# Patient Record
Sex: Female | Born: 1955 | Race: Black or African American | Hispanic: No | Smoking: Former smoker
Health system: Southern US, Community
[De-identification: ages and names within clinical notes are randomized; demographics above are authoritative.]

## PROBLEM LIST (undated history)

## (undated) DIAGNOSIS — F32A Depression, unspecified: Secondary | ICD-10-CM

## (undated) DIAGNOSIS — F329 Major depressive disorder, single episode, unspecified: Secondary | ICD-10-CM

## (undated) DIAGNOSIS — F99 Mental disorder, not otherwise specified: Secondary | ICD-10-CM

## (undated) DIAGNOSIS — I10 Essential (primary) hypertension: Secondary | ICD-10-CM

## (undated) HISTORY — DX: Mental disorder, not otherwise specified: F99

## (undated) HISTORY — PX: OTHER SURGICAL HISTORY: SHX169

## (undated) HISTORY — PX: ABDOMINAL HYSTERECTOMY: SHX81

## (undated) HISTORY — PX: TUBAL LIGATION: SHX77

---

## 2000-04-23 ENCOUNTER — Emergency Department (HOSPITAL_COMMUNITY): Admission: EM | Admit: 2000-04-23 | Discharge: 2000-04-23 | Payer: Self-pay | Admitting: Emergency Medicine

## 2000-04-23 ENCOUNTER — Encounter: Payer: Self-pay | Admitting: Emergency Medicine

## 2001-01-22 ENCOUNTER — Encounter: Admission: RE | Admit: 2001-01-22 | Discharge: 2001-01-22 | Payer: Self-pay | Admitting: Internal Medicine

## 2001-08-23 ENCOUNTER — Emergency Department (HOSPITAL_COMMUNITY): Admission: EM | Admit: 2001-08-23 | Discharge: 2001-08-23 | Payer: Self-pay | Admitting: Emergency Medicine

## 2002-05-25 ENCOUNTER — Emergency Department (HOSPITAL_COMMUNITY): Admission: EM | Admit: 2002-05-25 | Discharge: 2002-05-25 | Payer: Self-pay

## 2002-07-07 ENCOUNTER — Emergency Department (HOSPITAL_COMMUNITY): Admission: EM | Admit: 2002-07-07 | Discharge: 2002-07-07 | Payer: Self-pay | Admitting: Emergency Medicine

## 2002-10-24 ENCOUNTER — Ambulatory Visit (HOSPITAL_COMMUNITY): Admission: RE | Admit: 2002-10-24 | Discharge: 2002-10-24 | Payer: Self-pay | Admitting: Radiology

## 2002-10-31 ENCOUNTER — Encounter (INDEPENDENT_AMBULATORY_CARE_PROVIDER_SITE_OTHER): Payer: Self-pay | Admitting: *Deleted

## 2002-10-31 ENCOUNTER — Encounter: Admission: RE | Admit: 2002-10-31 | Discharge: 2002-10-31 | Payer: Self-pay | Admitting: Radiology

## 2003-03-21 ENCOUNTER — Encounter: Payer: Self-pay | Admitting: Emergency Medicine

## 2003-03-21 ENCOUNTER — Emergency Department (HOSPITAL_COMMUNITY): Admission: AD | Admit: 2003-03-21 | Discharge: 2003-03-21 | Payer: Self-pay | Admitting: Emergency Medicine

## 2004-04-04 ENCOUNTER — Emergency Department (HOSPITAL_COMMUNITY): Admission: EM | Admit: 2004-04-04 | Discharge: 2004-04-04 | Payer: Self-pay | Admitting: Family Medicine

## 2004-04-07 ENCOUNTER — Emergency Department (HOSPITAL_COMMUNITY): Admission: EM | Admit: 2004-04-07 | Discharge: 2004-04-07 | Payer: Self-pay | Admitting: *Deleted

## 2004-07-07 ENCOUNTER — Emergency Department (HOSPITAL_COMMUNITY): Admission: EM | Admit: 2004-07-07 | Discharge: 2004-07-07 | Payer: Self-pay | Admitting: *Deleted

## 2004-12-17 ENCOUNTER — Ambulatory Visit: Payer: Self-pay | Admitting: Internal Medicine

## 2004-12-27 ENCOUNTER — Ambulatory Visit: Payer: Self-pay | Admitting: *Deleted

## 2005-02-25 ENCOUNTER — Ambulatory Visit: Payer: Self-pay | Admitting: Internal Medicine

## 2005-04-30 ENCOUNTER — Ambulatory Visit: Payer: Self-pay | Admitting: Internal Medicine

## 2005-05-02 ENCOUNTER — Ambulatory Visit (HOSPITAL_COMMUNITY): Admission: RE | Admit: 2005-05-02 | Discharge: 2005-05-02 | Payer: Self-pay | Admitting: Internal Medicine

## 2005-05-06 ENCOUNTER — Ambulatory Visit (HOSPITAL_COMMUNITY): Admission: RE | Admit: 2005-05-06 | Discharge: 2005-05-06 | Payer: Self-pay | Admitting: Family Medicine

## 2005-05-09 ENCOUNTER — Ambulatory Visit: Payer: Self-pay | Admitting: Internal Medicine

## 2005-05-28 ENCOUNTER — Ambulatory Visit: Payer: Self-pay | Admitting: Nurse Practitioner

## 2006-01-16 ENCOUNTER — Ambulatory Visit: Payer: Self-pay | Admitting: Family Medicine

## 2006-03-05 ENCOUNTER — Ambulatory Visit: Payer: Self-pay | Admitting: Internal Medicine

## 2006-03-20 ENCOUNTER — Ambulatory Visit: Payer: Self-pay | Admitting: Family Medicine

## 2006-07-11 ENCOUNTER — Emergency Department (HOSPITAL_COMMUNITY): Admission: EM | Admit: 2006-07-11 | Discharge: 2006-07-12 | Payer: Self-pay | Admitting: Emergency Medicine

## 2006-08-31 ENCOUNTER — Ambulatory Visit: Payer: Self-pay | Admitting: Internal Medicine

## 2006-09-03 ENCOUNTER — Ambulatory Visit: Payer: Self-pay | Admitting: Internal Medicine

## 2006-09-07 ENCOUNTER — Ambulatory Visit: Payer: Self-pay | Admitting: Internal Medicine

## 2006-11-02 ENCOUNTER — Ambulatory Visit: Payer: Self-pay | Admitting: Internal Medicine

## 2007-07-06 DIAGNOSIS — L03119 Cellulitis of unspecified part of limb: Secondary | ICD-10-CM

## 2007-07-06 DIAGNOSIS — K625 Hemorrhage of anus and rectum: Secondary | ICD-10-CM

## 2007-07-06 DIAGNOSIS — R1013 Epigastric pain: Secondary | ICD-10-CM

## 2007-07-06 DIAGNOSIS — K3189 Other diseases of stomach and duodenum: Secondary | ICD-10-CM

## 2007-07-06 DIAGNOSIS — K120 Recurrent oral aphthae: Secondary | ICD-10-CM | POA: Insufficient documentation

## 2007-07-06 DIAGNOSIS — L02419 Cutaneous abscess of limb, unspecified: Secondary | ICD-10-CM | POA: Insufficient documentation

## 2007-09-01 ENCOUNTER — Encounter (INDEPENDENT_AMBULATORY_CARE_PROVIDER_SITE_OTHER): Payer: Self-pay | Admitting: *Deleted

## 2007-10-28 ENCOUNTER — Ambulatory Visit: Payer: Self-pay | Admitting: Internal Medicine

## 2007-10-28 LAB — CONVERTED CEMR LAB
ALT: 9 units/L (ref 0–35)
AST: 14 units/L (ref 0–37)
Alkaline Phosphatase: 91 units/L (ref 39–117)
BUN: 10 mg/dL (ref 6–23)
Chloride: 106 meq/L (ref 96–112)
Creatinine, Ser: 0.69 mg/dL (ref 0.40–1.20)
Glucose, Bld: 87 mg/dL (ref 70–99)
TSH: 1.077 microintl units/mL (ref 0.350–5.50)
Total Protein: 7.2 g/dL (ref 6.0–8.3)

## 2009-04-23 ENCOUNTER — Ambulatory Visit: Payer: Self-pay | Admitting: *Deleted

## 2009-04-23 ENCOUNTER — Ambulatory Visit: Payer: Self-pay | Admitting: Family Medicine

## 2009-05-18 ENCOUNTER — Ambulatory Visit: Payer: Self-pay | Admitting: Family Medicine

## 2009-05-18 ENCOUNTER — Ambulatory Visit (HOSPITAL_COMMUNITY): Admission: RE | Admit: 2009-05-18 | Discharge: 2009-05-18 | Payer: Self-pay | Admitting: Family Medicine

## 2009-05-23 ENCOUNTER — Ambulatory Visit: Payer: Self-pay | Admitting: Family Medicine

## 2010-03-29 ENCOUNTER — Ambulatory Visit: Payer: Self-pay | Admitting: Family Medicine

## 2010-07-09 ENCOUNTER — Emergency Department (HOSPITAL_COMMUNITY): Admission: EM | Admit: 2010-07-09 | Discharge: 2010-07-09 | Payer: Self-pay | Admitting: Emergency Medicine

## 2010-07-25 ENCOUNTER — Ambulatory Visit: Payer: Self-pay | Admitting: Family Medicine

## 2011-11-24 ENCOUNTER — Other Ambulatory Visit: Payer: Self-pay | Admitting: Obstetrics and Gynecology

## 2011-11-24 DIAGNOSIS — Z1231 Encounter for screening mammogram for malignant neoplasm of breast: Secondary | ICD-10-CM

## 2011-11-25 ENCOUNTER — Other Ambulatory Visit: Payer: Self-pay | Admitting: Obstetrics and Gynecology

## 2011-11-25 ENCOUNTER — Ambulatory Visit (HOSPITAL_COMMUNITY): Payer: Self-pay | Attending: Obstetrics and Gynecology

## 2011-11-25 ENCOUNTER — Ambulatory Visit (INDEPENDENT_AMBULATORY_CARE_PROVIDER_SITE_OTHER): Payer: Self-pay | Admitting: *Deleted

## 2011-11-25 VITALS — BP 172/94 | HR 73 | Temp 97.4°F | Resp 20 | Ht 67.0 in | Wt 232.6 lb

## 2011-11-25 DIAGNOSIS — Z01419 Encounter for gynecological examination (general) (routine) without abnormal findings: Secondary | ICD-10-CM

## 2011-11-25 DIAGNOSIS — R229 Localized swelling, mass and lump, unspecified: Secondary | ICD-10-CM

## 2011-11-25 DIAGNOSIS — N898 Other specified noninflammatory disorders of vagina: Secondary | ICD-10-CM

## 2011-11-25 DIAGNOSIS — R2230 Localized swelling, mass and lump, unspecified upper limb: Secondary | ICD-10-CM

## 2011-11-25 DIAGNOSIS — N63 Unspecified lump in unspecified breast: Secondary | ICD-10-CM

## 2011-11-25 NOTE — Progress Notes (Signed)
Complaints of swelling in left axilla.  Pap Smear:    Completed Pap smear today since last Pap smear was around 6 years ago per patient. Patient has a history of a hysterectomy for fibroids. Per patient has no history of abnormal Pap smears. If this Pap smear comes back normal then patient will not need any further Pap smears per BCCCP and ACOG guidelines. No Pap smear results in EPIC.  Physical exam: Breasts Breasts symmetrical. No skin abnormalities bilateral breasts. No nipple retraction bilateral breasts. No nipple discharge bilateral breasts. No lymphadenopathy. No lumps palpated right breast. Palpated enlarged area in left axilla that patient complains of tenderness at times. Otherwise no lumps palpated in left breast. Patient referred to the [facility] for Diagnostic Mammogram and possible breast ultrasound.          Pelvic/Bimanual   Ext Genitalia No lesions, no swelling and no discharge observed on external genitalia.         Vagina Vagina pink and of normal texture. Vagina friable with broom. No lesions observed in vagina. White frothy discharge observed with odor in vagina. Wet prep completed today.         Cervix Cervix is not present related to a history of a hysterectomy.          Uterus Uterus is absent due to a history of a hysterectomy.         Adnexae Bilateral ovaries not palpable. Patient unsure if ovaries were removed with hysterectomy. No tenderness on palpation.        Rectovaginal No rectal exam completed today since patient had no rectal complaints. No skin abnormalities observed on exam.

## 2011-11-25 NOTE — Patient Instructions (Signed)
Taught patient how to perform BSE and gave educational materials to take home. Completed Pap smear today since patient thinks last Pap smear was around 6 years ago. Patient has a history of a hysterectomy for fibroids. Told patient if this Pap smear comes back normal that she will not need further Pap smears per BCCCP guidelines. Informed patient that will call with results for wet prep. Patient is scheduled for a diagnostic mammogram and possible breast ultrasound December 10, 2011 at 1340.  Patient aware of appointment and will be there. Let patient know will follow up with her within the next couple weeks with results by letter or phone. Patient verbalized understanding.

## 2011-11-27 LAB — WET PREP, GENITAL

## 2011-11-28 ENCOUNTER — Telehealth: Payer: Self-pay | Admitting: *Deleted

## 2011-11-28 MED ORDER — METRONIDAZOLE 500 MG PO TABS: 500.0000 mg | ORAL_TABLET | Freq: Two times a day (BID) | ORAL | Status: DC

## 2011-11-28 NOTE — Progress Notes (Signed)
Addended by: Catalina Antigua on: 11/28/2011 11:04 AM   Modules accepted: Orders

## 2011-11-28 NOTE — Telephone Encounter (Signed)
Called patient to give results to Pap smear and wet prep. Told patient the Pap smear showed Trichomonas and the Wet Prep showed BV. Let her know that a prescription for Flagyl  has been sent to [facility] for her to pick up. Let her know she will need to take as prescribed and avoid alcohol while taking. Told patient that her partner will need to be treated also to avoid re-infection. Let her know [facility] of [facility] or go to his primary care physician. Told patient if she has any further questions to call me. Patient verbalized understanding.

## 2011-12-22 ENCOUNTER — Encounter (HOSPITAL_COMMUNITY): Payer: Self-pay | Admitting: *Deleted

## 2011-12-22 ENCOUNTER — Emergency Department (HOSPITAL_COMMUNITY): Payer: Self-pay

## 2011-12-22 ENCOUNTER — Emergency Department (HOSPITAL_COMMUNITY)
Admission: EM | Admit: 2011-12-22 | Discharge: 2011-12-22 | Disposition: A | Discharge status: Home / Self Care | Payer: Self-pay | Attending: Emergency Medicine | Admitting: Emergency Medicine

## 2011-12-22 ENCOUNTER — Other Ambulatory Visit: Payer: Self-pay

## 2011-12-22 DIAGNOSIS — F17200 Nicotine dependence, unspecified, uncomplicated: Secondary | ICD-10-CM | POA: Insufficient documentation

## 2011-12-22 DIAGNOSIS — R071 Chest pain on breathing: Secondary | ICD-10-CM | POA: Insufficient documentation

## 2011-12-22 DIAGNOSIS — R0602 Shortness of breath: Secondary | ICD-10-CM | POA: Insufficient documentation

## 2011-12-22 DIAGNOSIS — R0789 Other chest pain: Secondary | ICD-10-CM

## 2011-12-22 LAB — COMPREHENSIVE METABOLIC PANEL
Alkaline Phosphatase: 93 U/L (ref 39–117)
CO2: 22 mEq/L (ref 19–32)
Calcium: 9.4 mg/dL (ref 8.4–10.5)
Chloride: 107 mEq/L (ref 96–112)
Creatinine, Ser: 0.62 mg/dL (ref 0.50–1.10)
Glucose, Bld: 80 mg/dL (ref 70–99)
Potassium: 4.4 mEq/L (ref 3.5–5.1)
Total Protein: 7.2 g/dL (ref 6.0–8.3)

## 2011-12-22 LAB — CBC
MCH: 29.1 pg (ref 26.0–34.0)
MCHC: 32.9 g/dL (ref 30.0–36.0)
MCV: 88.4 fL (ref 78.0–100.0)
Platelets: 260 10*3/uL (ref 150–400)
RDW: 13.8 % (ref 11.5–15.5)

## 2011-12-22 LAB — CARDIAC PANEL(CRET KIN+CKTOT+MB+TROPI): Total CK: 123 U/L (ref 7–177)

## 2011-12-22 MED ORDER — IBUPROFEN 800 MG PO TABS: 800.0000 mg | ORAL_TABLET | Freq: Three times a day (TID) | ORAL | Status: AC

## 2011-12-22 MED ORDER — SODIUM CHLORIDE 0.9 % IV SOLN
999.0000 mL | Freq: Once | INTRAVENOUS | Status: AC
  Administered 2011-12-22: 999 mL via INTRAVENOUS

## 2011-12-22 MED ORDER — KETOROLAC TROMETHAMINE 30 MG/ML IJ SOLN
30.0000 mg | Freq: Once | INTRAMUSCULAR | Status: AC
  Administered 2011-12-22: 30 mg via INTRAVENOUS
  Filled 2011-12-22: qty 1

## 2011-12-22 NOTE — ED Provider Notes (Signed)
History     CSN: [id number]  Arrival date & time 12/22/11  0746   First MD Initiated Contact with Patient 12/22/11 0801      Chief Complaint  Patient presents with  . Chest Pain    (Consider location/radiation/quality/duration/timing/severity/associated sxs/prior treatment) HPI The patient presents with right sided chest pain, mild associated shortness of breath. The pain began insidiously, 3 weeks ago, since onset has been intermittent. The pain is focally about the right inferior anterior axillary line. The pain is described as "weird". It is nonradiating, not improved with Advil, worse with positioning, and (minimally) with deep inspiration. No left-sided chest pain, no near-syncope, no vomiting. Patient also describes mild diaphoresis. Pain is nonexertional. The patient denies smoking, significant travel history. She does note significant family history of cardiac disease, including 2 sisters with implanted cardiac devices Past Medical History  Diagnosis Date  . Mental disorder     No past surgical history on file.  Family History  Problem Relation Age of Onset  . Breast cancer Mother   . Breast cancer Sister     History  Substance Use Topics  . Smoking status: Current Some Day Smoker -- 0.1 packs/day for 10 years    Types: Cigarettes  . Smokeless tobacco: Not on file  . Alcohol Use: No    OB History    Grav Para Term Preterm Abortions TAB SAB Ect Mult Living   2         2      Review of Systems  Constitutional:       HPI  HENT:       HPI otherwise negative  Eyes: Negative.   Respiratory:       HPI, otherwise negative  Cardiovascular:       HPI, otherwise nmegative  Gastrointestinal: Negative for vomiting.  Genitourinary:       HPI, otherwise negative  Musculoskeletal:       HPI, otherwise negative  Skin: Negative.   Neurological: Negative for syncope.    Allergies  Sulfonamide derivatives and Tramadol hcl  Home Medications   Current Outpatient  Rx  Name Route Sig Dispense Refill  . IBUPROFEN 200 MG PO TABS Oral Take 400 mg by mouth every 6 (six) hours as needed. For pain       BP 131/70  Pulse 74  Temp(Src) 97.4 F (36.3 C) (Oral)  Resp 18  Ht 5\' 7"  (1.702 m)  Wt 240 lb (108.863 kg)  BMI 37.59 kg/m2  SpO2 96%  Physical Exam  Nursing note and vitals reviewed. Constitutional: She is oriented to person, place, and time. She appears well-developed and well-nourished. No distress.  HENT:  Head: Normocephalic and atraumatic.  Eyes: Conjunctivae and EOM are normal.  Cardiovascular: Normal rate and regular rhythm.   Pulmonary/Chest: Effort normal and breath sounds normal. No stridor. No respiratory distress.       Pain on palpation about the right inferior anterior axillary line without appreciable deformity  Abdominal: She exhibits no distension.  Musculoskeletal: She exhibits no edema.  Neurological: She is alert and oriented to person, place, and time. No cranial nerve deficit.  Skin: Skin is warm and dry.  Psychiatric: She has a normal mood and affect.    ED Course  Procedures (including critical care time)   Labs Reviewed  CARDIAC PANEL(CRET KIN+CKTOT+MB+TROPI)  CBC  COMPREHENSIVE METABOLIC PANEL   No results found.   No diagnosis found.  Pulse ox 98%, room air normal Cardiac monitor 75, sinus rhythm,  normal  CXR: reviewed by me, no acute findings     Date: 12/22/2011  Rate: 65  Rhythm: normal sinus rhythm  QRS Axis: normal  Intervals: normal  ST/T Wave abnormalities: normal and nonspecific T wave changes  Conduction Disutrbances:none  Narrative Interpretation:   Old EKG Reviewed: none available ABNORMAL ECG   MDM  This 67 female presents with right sided chest pain. On exam the patient is in no distress, is not tachypneic, nor hypoxic. The patient's story is most consistent with musculoskeletal etiology, though with her notable family history of cardiac disease she had laboratory evaluation. The  patient's labs are reassuring, as is her x-ray that does not show a notable findings. The patient's ECG is a similarly reassuring. Given his absence of acute findings, the patient's mild improvement with ED interventions, she'll be discharged with explicit care instructions for this episode of chest wall pain       Gerhard Munch, MD 12/22/11 1034

## 2011-12-22 NOTE — ED Notes (Signed)
C/o intermittent right side CP, SOB, diaphoresis x 3 weeks. Denies cold,cough.

## 2011-12-22 NOTE — ED Notes (Signed)
Pt d/c home in NAD. Pt states that she will catch the bus back to the [facility] [place] she [facility].

## 2011-12-22 NOTE — ED Notes (Signed)
Pt in NAD at this time. Pt resting with ice placed on right side.

## 2011-12-23 ENCOUNTER — Other Ambulatory Visit: Payer: Self-pay | Admitting: Obstetrics and Gynecology

## 2011-12-23 ENCOUNTER — Ambulatory Visit
Admission: RE | Admit: 2011-12-23 | Discharge: 2011-12-23 | Disposition: A | Discharge status: Home / Self Care | Payer: Self-pay | Source: Ambulatory Visit | Attending: Obstetrics and Gynecology | Admitting: Obstetrics and Gynecology

## 2011-12-23 DIAGNOSIS — N63 Unspecified lump in unspecified breast: Secondary | ICD-10-CM

## 2011-12-29 ENCOUNTER — Telehealth: Payer: Self-pay | Admitting: *Deleted

## 2011-12-29 NOTE — Telephone Encounter (Signed)
Called patient to follow up on Diagnostic mammogram results. Patient stated she did receive [facility] from the [facility] and everything was normal. Reminded patient she will need a screening mammogram at 1 year. Also, asked patient if she picked up her prescription for Flagyl and patient stated she did. Asked patient if she is still having symptoms and patient stated no. Patient did state her partner is unwilling to follow up and get treatment. Patient stated she is no longer involved with partner. Patient verbalized understanding.

## 2012-01-29 ENCOUNTER — Other Ambulatory Visit: Payer: Self-pay | Admitting: Obstetrics and Gynecology

## 2012-01-29 NOTE — Telephone Encounter (Signed)
Pt left message on voice mail that she is still having vag d/c with odor.  She states that she called her pharmacy for a refill also and they have requested medication. I told pt that I will send the refill authorization (per standing protocol).  Pt voiced understanding.

## 2013-02-01 ENCOUNTER — Emergency Department (HOSPITAL_COMMUNITY)
Admission: EM | Admit: 2013-02-01 | Discharge: 2013-02-01 | Disposition: A | Discharge status: Home / Self Care | Payer: Self-pay | Attending: Emergency Medicine | Admitting: Emergency Medicine

## 2013-02-01 ENCOUNTER — Encounter (HOSPITAL_COMMUNITY): Payer: Self-pay | Admitting: Emergency Medicine

## 2013-02-01 DIAGNOSIS — M545 Low back pain, unspecified: Secondary | ICD-10-CM | POA: Insufficient documentation

## 2013-02-01 DIAGNOSIS — M549 Dorsalgia, unspecified: Secondary | ICD-10-CM

## 2013-02-01 DIAGNOSIS — Z90711 Acquired absence of uterus with remaining cervical stump: Secondary | ICD-10-CM | POA: Insufficient documentation

## 2013-02-01 DIAGNOSIS — G8929 Other chronic pain: Secondary | ICD-10-CM | POA: Insufficient documentation

## 2013-02-01 DIAGNOSIS — Z8739 Personal history of other diseases of the musculoskeletal system and connective tissue: Secondary | ICD-10-CM | POA: Insufficient documentation

## 2013-02-01 DIAGNOSIS — Z9851 Tubal ligation status: Secondary | ICD-10-CM | POA: Insufficient documentation

## 2013-02-01 DIAGNOSIS — F17200 Nicotine dependence, unspecified, uncomplicated: Secondary | ICD-10-CM | POA: Insufficient documentation

## 2013-02-01 DIAGNOSIS — Z8659 Personal history of other mental and behavioral disorders: Secondary | ICD-10-CM | POA: Insufficient documentation

## 2013-02-01 DIAGNOSIS — M79609 Pain in unspecified limb: Secondary | ICD-10-CM | POA: Insufficient documentation

## 2013-02-01 DIAGNOSIS — R109 Unspecified abdominal pain: Secondary | ICD-10-CM | POA: Insufficient documentation

## 2013-02-01 LAB — CBC
MCH: 28.4 pg (ref 26.0–34.0)
MCV: 87.2 fL (ref 78.0–100.0)
RDW: 13.9 % (ref 11.5–15.5)
WBC: 4.3 10*3/uL (ref 4.0–10.5)

## 2013-02-01 LAB — URINALYSIS, ROUTINE W REFLEX MICROSCOPIC
Bilirubin Urine: NEGATIVE
Ketones, ur: NEGATIVE mg/dL
Nitrite: NEGATIVE
Protein, ur: NEGATIVE mg/dL
Urobilinogen, UA: 0.2 mg/dL (ref 0.0–1.0)

## 2013-02-01 MED ORDER — OXYCODONE-ACETAMINOPHEN 5-325 MG PO TABS
2.0000 | ORAL_TABLET | Freq: Once | ORAL | Status: AC
  Administered 2013-02-01: 2 via ORAL
  Filled 2013-02-01: qty 2

## 2013-02-01 MED ORDER — ONDANSETRON 4 MG PO TBDP
4.0000 mg | ORAL_TABLET | Freq: Once | ORAL | Status: AC
  Administered 2013-02-01: 4 mg via ORAL
  Filled 2013-02-01: qty 1

## 2013-02-01 MED ORDER — HYDROCODONE-ACETAMINOPHEN 5-325 MG PO TABS: 1.0000 | ORAL_TABLET | ORAL | Status: DC | PRN

## 2013-02-01 MED ORDER — MELOXICAM 15 MG PO TABS: 15.0000 mg | ORAL_TABLET | Freq: Every day | ORAL | Status: AC

## 2013-02-01 MED ORDER — DEXAMETHASONE SODIUM PHOSPHATE 10 MG/ML IJ SOLN
10.0000 mg | Freq: Once | INTRAMUSCULAR | Status: AC
  Administered 2013-02-01: 10 mg via INTRAMUSCULAR
  Filled 2013-02-01: qty 1

## 2013-02-01 NOTE — ED Provider Notes (Signed)
History     CSN: [id number]  Arrival date & time 02/01/13  4098   First MD Initiated Contact with Patient 02/01/13 [phone]      Chief Complaint  Patient presents with  . Back Pain  . Flank Pain    left side    (Consider location/radiation/quality/duration/timing/severity/associated sxs/prior treatment) HPI  57 year old female with past medical history of chronic back pain presents the emergency department with chief complaint of severe lower back pain.  Patient has known degenerative disc disease and annular tear confirmed on MRI.  She has had chronic left-sided sciatic pain pattern for many years.  Patient states that Sunday she reached up to turn a doorknob and looked her back spasm.  She's had severe pain not and states that she "could not take it anymore."  Patient states that she does not use any pain medications and has no current primary care physician.  She has been vocational rehabilitation.  The patient was a [facility] patient. She compliants of shooting pain left leg 10/10. Unable to complete ADLs at this time.  Ambulatory. Denies urinary and vaginal sxs. She c/o new onset swelling in the left leg. Denies cp/sob Denies weakness, loss of bowel/bladder function or saddle anesthesia. Denies neck stiffness, headache, rash.  Denies fever or recent procedures to back.    Past Medical History  Diagnosis Date  . Mental disorder     Past Surgical History  Procedure Laterality Date  . Tubal ligation Bilateral   . Abdominal hysterectomy      partial  . Cyst removed from spine      Family History  Problem Relation Age of Onset  . Breast cancer Mother   . Breast cancer Sister     History  Substance Use Topics  . Smoking status: Current Some Day Smoker -- 0.10 packs/day for 10 years    Types: Cigarettes  . Smokeless tobacco: Not on file  . Alcohol Use: No    OB History   Grav Para Term Preterm Abortions TAB SAB Ect Mult Living   2         2      Review of  Systems Ten systems reviewed and are negative for acute change, except as noted in the HPI.   Allergies  Sulfonamide derivatives and Tramadol hcl  Home Medications   Current Outpatient Rx  Name  Route  Sig  Dispense  Refill  . ibuprofen (ADVIL,MOTRIN) 200 MG tablet   Oral   Take 400 mg by mouth every 6 (six) hours as needed. For pain          . naproxen sodium (ANAPROX) 220 MG tablet   Oral   Take 220 mg by mouth 2 (two) times daily with a meal.           BP 141/69  Pulse 60  Temp(Src) 98.4 F (36.9 C) (Oral)  Resp 18  Ht 5' 7.5" (1.715 m)  Wt 240 lb (108.863 kg)  BMI 37.01 kg/m2  SpO2 100%  Physical Exam  Physical Exam  Nursing note and vitals reviewed. Constitutional: She is oriented to person, place, and time. She appears well-developed and well-nourished. No distress.  HENT:  Head: Normocephalic and atraumatic.  Eyes: Conjunctivae normal and EOM are normal. Pupils are equal, round, and reactive to light. No scleral icterus.  Neck: Normal range of motion.  Cardiovascular: Normal rate, regular rhythm and normal heart sounds.  Exam reveals no gallop and no friction rub.  Distal pulses intact.  No signs of DVT. Homans negative. No Evidence of UL leg swelling. No murmur heard. Pulmonary/Chest: Effort normal and breath sounds normal. No respiratory distress.  Abdominal: Soft. Bowel sounds are normal. She exhibits no distension and no mass. There is no tenderness. There is no guarding.  Neurological: She is alert and oriented to person, place, and time.  Musculoskeletal:Back exam: antalgic gait, limited range of motion, pain with motion noted during exam, tenderness noted Lumbar paraspinals, sacroiliac joints and sciatic notches nontender, positive straight-leg raise L, Normal DTRs. Sensation intact. Strength/ROM limited by pain.   Skin: Skin is warm and dry. She is not diaphoretic.    ED Course  Procedures (including critical care time)  Labs Reviewed  CBC   URINALYSIS, ROUTINE W REFLEX MICROSCOPIC   No results found.   1. Back pain       MDM  7:59 AM BP 141/69  Pulse 60  Temp(Src) 98.4 F (36.9 C) (Oral)  Resp 18  Ht 5' 7.5" (1.715 m)  Wt 240 lb (108.863 kg)  BMI 37.01 kg/m2  SpO2 100% Patient with pain issue. UA and labs pending.  Patient handed me her radiology report that showed her MRI. I do not suspect DVT in leg as patients' subjective c/o leg swelling is not apparent pn PE. Will r/o UTI as cause of pain.  No imaging necessary at this time as there was no recent MOI.    10:16 AM No UTI. Patient is  Very much decreased with 2 Percocet.  She does state that she feels "a little woozy."  Patient with back pain.  No neurological deficits and normal neuro exam.  Patient can walk but states is painful.  No loss of bowel or bladder control.  No concern for cauda equina.  No fever, night sweats, weight loss, h/o cancer, IVDU.  RICE protocol and pain medicine indicated and discussed with patient.     Arthor Captain, PA-C 02/01/13 1024

## 2013-02-01 NOTE — ED Notes (Signed)
Pt resting comfortably, pain is reduced. No needs at this time.

## 2013-02-01 NOTE — ED Provider Notes (Signed)
Medical screening examination/treatment/procedure(s) were performed by non-physician practitioner and as supervising physician I was immediately available for consultation/collaboration.   Carleene Cooper III, MD 02/01/13 [phone]

## 2013-02-01 NOTE — ED Notes (Signed)
Patient complaint of lower back and left flank pain radiating into left lower quadrant.  Patient states she has had lower back problems for a while.  Patient also states that she was frequent falls.

## 2013-02-01 NOTE — ED Notes (Signed)
Pt has ride coming to pick her up.

## 2014-01-28 ENCOUNTER — Encounter (HOSPITAL_COMMUNITY): Payer: Self-pay | Admitting: Emergency Medicine

## 2014-01-28 ENCOUNTER — Emergency Department (INDEPENDENT_AMBULATORY_CARE_PROVIDER_SITE_OTHER)
Admission: EM | Admit: 2014-01-28 | Discharge: 2014-01-28 | Disposition: A | Discharge status: Home / Self Care | Payer: Self-pay | Source: Home / Self Care | Attending: Family Medicine | Admitting: Family Medicine

## 2014-01-28 ENCOUNTER — Emergency Department (INDEPENDENT_AMBULATORY_CARE_PROVIDER_SITE_OTHER): Payer: Self-pay

## 2014-01-28 DIAGNOSIS — J309 Allergic rhinitis, unspecified: Secondary | ICD-10-CM

## 2014-01-28 LAB — POCT RAPID STREP A: STREPTOCOCCUS, GROUP A SCREEN (DIRECT): NEGATIVE

## 2014-01-28 MED ORDER — LORATADINE 10 MG PO TABS: 10.0000 mg | ORAL_TABLET | Freq: Every day | ORAL | Status: DC

## 2014-01-28 MED ORDER — BENZONATATE 100 MG PO CAPS: 100.0000 mg | ORAL_CAPSULE | Freq: Three times a day (TID) | ORAL | Status: AC | PRN

## 2014-01-28 MED ORDER — IPRATROPIUM BROMIDE 0.06 % NA SOLN: 2.0000 | Freq: Four times a day (QID) | NASAL | Status: AC

## 2014-01-28 NOTE — Discharge Instructions (Signed)
Your chest xray was normal and your strep test was negative. You may benefit from treatment for environmental allergies. Please use medications as prescribed and follow up with your doctor if symptoms do not improve. You exam does not suggest the need for additional antibiotics.

## 2014-01-28 NOTE — ED Provider Notes (Signed)
CSN: [id number]     Arrival date & time 01/28/14  1346 History   First MD Initiated Contact with Patient 01/28/14 1458     Chief Complaint  Patient presents with  . URI     (Consider location/radiation/quality/duration/timing/severity/associated sxs/prior Treatment) HPI Comments: Patient presents with what she describes as recurrent URI sx. States that every 2-4 weeks she develops ear pain and congestion, sore throat, rhinorrhea, nasal congestion. Cough, nausea without vomiting, and malaise. States current episode began two weeks ago. States he doctor has treated her with multiple courses of antibiotics but states "my symptoms always come back the day after I finish the medication."  Current symptoms include ear pain and congestion, sore throat, rhinorrhea, nasal congestion, cough and nausea. No report of fever diarrhea or vomiting.  No known ill contacts.  Patient is smoker and did not receive 2014-2015 flu shot.  Denies GU sx. No polyuria or polydipsia  Patient is a 58 y.o. female presenting with URI.  URI   Past Medical History  Diagnosis Date  . Mental disorder    Past Surgical History  Procedure Laterality Date  . Tubal ligation Bilateral   . Abdominal hysterectomy      partial  . Cyst removed from spine     Family History  Problem Relation Age of Onset  . Breast cancer Mother   . Breast cancer Sister    History  Substance Use Topics  . Smoking status: Current Some Day Smoker -- 0.10 packs/day for 10 years    Types: Cigarettes  . Smokeless tobacco: Not on file  . Alcohol Use: No   OB History   Grav Para Term Preterm Abortions TAB SAB Ect Mult Living   2         2     Review of Systems  All other systems reviewed and are negative.      Allergies  Sulfonamide derivatives and Tramadol hcl  Home Medications   Current Outpatient Rx  Name  Route  Sig  Dispense  Refill  . HYDROcodone-acetaminophen (NORCO) 5-325 MG per tablet   Oral   Take 1-2 tablets by  mouth every 4 (four) hours as needed for pain.   30 tablet   0   . ibuprofen (ADVIL,MOTRIN) 200 MG tablet   Oral   Take 400 mg by mouth every 6 (six) hours as needed. For pain          . meloxicam (MOBIC) 15 MG tablet   Oral   Take 1 tablet (15 mg total) by mouth daily.   10 tablet   0   . naproxen sodium (ANAPROX) 220 MG tablet   Oral   Take 220 mg by mouth 2 (two) times daily with a meal.          BP 151/77  Pulse 55  Temp(Src) 97.9 F (36.6 C) (Oral)  Resp 18  SpO2 96% Physical Exam  Nursing note and vitals reviewed. Constitutional: She is oriented to person, place, and time. She appears well-developed and well-nourished. No distress.  HENT:  Head: Normocephalic and atraumatic.  Right Ear: Hearing, external ear and ear canal normal.  Left Ear: Hearing, tympanic membrane, external ear and ear canal normal.  Nose: Nose normal.  Mouth/Throat: Uvula is midline, oropharynx is clear and moist and mucous membranes are normal.  Mild injection of right TM. No effusion or opacification  Eyes: Conjunctivae are normal. Right eye exhibits no discharge. Left eye exhibits no discharge. No scleral icterus.  Neck:  Normal range of motion. Neck supple.  Cardiovascular: Regular rhythm and normal heart sounds.   +mild bradycardia  Pulmonary/Chest: Effort normal and breath sounds normal. No respiratory distress. She has no wheezes.  Abdominal: Soft. Bowel sounds are normal.  Musculoskeletal: Normal range of motion.  Lymphadenopathy:    She has no cervical adenopathy.  Neurological: She is alert and oriented to person, place, and time.  Skin: Skin is warm and dry. No rash noted.  Psychiatric: She has a normal mood and affect. Her behavior is normal.    ED Course  Procedures (including critical care time) Labs Review Labs Reviewed  POCT RAPID STREP A ([facility] CARE ONLY)   Imaging Review No results found.    MDM   Final diagnoses:  None  CXR: unremarkable Rapid strep  negative. Exam does not suggest need for additional antibiotics. This patient may benefit from treatment for allergic rhinitis. Will Rx atrovent nasal spray, tessalon for cough and claritin daily. Advise PCP follow up if no improvement.   Jess BartersJennifer Lee Mary EstherPresson, GeorgiaPA 01/28/14 [phone]

## 2014-01-28 NOTE — ED Provider Notes (Signed)
Medical screening examination/treatment/procedure(s) were performed by resident physician or non-physician practitioner and as supervising physician I was immediately available for consultation/collaboration.   Amedeo Detweiler DOUGLAS MD.   Mysha Peeler D Nel Stoneking, MD 01/28/14 1721 

## 2014-01-28 NOTE — ED Notes (Signed)
Pt c/o cold sxs onset 1 week Sxs include: cough, runny nose, HA, ST, fatigue, nauseas Denies f/v/d Taking ibup and aleve w/no relief Alert w/no signs of acute distress.

## 2014-01-30 LAB — CULTURE, GROUP A STREP

## 2014-03-10 DIAGNOSIS — Z87898 Personal history of other specified conditions: Secondary | ICD-10-CM | POA: Insufficient documentation

## 2014-03-10 DIAGNOSIS — I10 Essential (primary) hypertension: Secondary | ICD-10-CM | POA: Insufficient documentation

## 2014-10-16 ENCOUNTER — Encounter (HOSPITAL_COMMUNITY): Payer: Self-pay | Admitting: Emergency Medicine

## 2014-12-29 ENCOUNTER — Emergency Department (HOSPITAL_COMMUNITY): Payer: Medicaid Other

## 2014-12-29 ENCOUNTER — Encounter (HOSPITAL_COMMUNITY): Payer: Self-pay | Admitting: Emergency Medicine

## 2014-12-29 ENCOUNTER — Emergency Department (HOSPITAL_COMMUNITY)
Admission: EM | Admit: 2014-12-29 | Discharge: 2014-12-29 | Disposition: A | Discharge status: Home / Self Care | Payer: Medicaid Other | Attending: Emergency Medicine | Admitting: Emergency Medicine

## 2014-12-29 DIAGNOSIS — I10 Essential (primary) hypertension: Secondary | ICD-10-CM | POA: Insufficient documentation

## 2014-12-29 DIAGNOSIS — Z87891 Personal history of nicotine dependence: Secondary | ICD-10-CM | POA: Insufficient documentation

## 2014-12-29 DIAGNOSIS — Z8659 Personal history of other mental and behavioral disorders: Secondary | ICD-10-CM | POA: Diagnosis not present

## 2014-12-29 DIAGNOSIS — R079 Chest pain, unspecified: Secondary | ICD-10-CM

## 2014-12-29 DIAGNOSIS — Z791 Long term (current) use of non-steroidal anti-inflammatories (NSAID): Secondary | ICD-10-CM | POA: Insufficient documentation

## 2014-12-29 DIAGNOSIS — Z79899 Other long term (current) drug therapy: Secondary | ICD-10-CM | POA: Diagnosis not present

## 2014-12-29 HISTORY — DX: Depression, unspecified: F32.A

## 2014-12-29 HISTORY — DX: Major depressive disorder, single episode, unspecified: F32.9

## 2014-12-29 HISTORY — DX: Essential (primary) hypertension: I10

## 2014-12-29 LAB — CBC WITH DIFFERENTIAL/PLATELET
BASOS ABS: 0 10*3/uL (ref 0.0–0.1)
BASOS PCT: 1 % (ref 0–1)
EOS PCT: 2 % (ref 0–5)
Eosinophils Absolute: 0.1 10*3/uL (ref 0.0–0.7)
HEMATOCRIT: 37.3 % (ref 36.0–46.0)
Hemoglobin: 12.2 g/dL (ref 12.0–15.0)
LYMPHS ABS: 1.4 10*3/uL (ref 0.7–4.0)
LYMPHS PCT: 31 % (ref 12–46)
MCH: 28.8 pg (ref 26.0–34.0)
MCHC: 32.7 g/dL (ref 30.0–36.0)
MCV: 88.2 fL (ref 78.0–100.0)
Monocytes Absolute: 0.4 10*3/uL (ref 0.1–1.0)
Monocytes Relative: 9 % (ref 3–12)
NEUTROS PCT: 57 % (ref 43–77)
Neutro Abs: 2.5 10*3/uL (ref 1.7–7.7)
PLATELETS: 258 10*3/uL (ref 150–400)
RBC: 4.23 MIL/uL (ref 3.87–5.11)
RDW: 14.1 % (ref 11.5–15.5)
WBC: 4.5 10*3/uL (ref 4.0–10.5)

## 2014-12-29 LAB — I-STAT TROPONIN, ED
Troponin i, poc: 0 ng/mL (ref 0.00–0.08)
Troponin i, poc: 0 ng/mL (ref 0.00–0.08)

## 2014-12-29 LAB — I-STAT CHEM 8, ED
BUN: 17 mg/dL (ref 6–23)
CALCIUM ION: 1.2 mmol/L (ref 1.12–1.23)
CHLORIDE: 106 meq/L (ref 96–112)
Creatinine, Ser: 0.8 mg/dL (ref 0.50–1.10)
Glucose, Bld: 87 mg/dL (ref 70–99)
HEMATOCRIT: 40 % (ref 36.0–46.0)
Hemoglobin: 13.6 g/dL (ref 12.0–15.0)
Potassium: 4.1 mmol/L (ref 3.5–5.1)
Sodium: 142 mmol/L (ref 135–145)
TCO2: 20 mmol/L (ref 0–100)

## 2014-12-29 MED ORDER — ACETAMINOPHEN 325 MG PO TABS
650.0000 mg | ORAL_TABLET | Freq: Once | ORAL | Status: AC
  Administered 2014-12-29: 650 mg via ORAL
  Filled 2014-12-29: qty 2

## 2014-12-29 NOTE — Discharge Instructions (Signed)

## 2014-12-29 NOTE — ED Notes (Signed)
Pt undressed, in gown, on monitor and EKG performed 

## 2014-12-29 NOTE — ED Notes (Signed)
Pt states that she woke up this morning around 0800 with a tightness in her chest that felt like "contractions" it was coming and going. Pt states she is short of breath and nauseous denies any vomiting.

## 2014-12-29 NOTE — ED Provider Notes (Signed)
CSN: [id number]     Arrival date & time 12/29/14  4098 History   First MD Initiated Contact with Patient 12/29/14 [phone]     Chief Complaint  Patient presents with  . Chest Pain     (Consider location/radiation/quality/duration/timing/severity/associated sxs/prior Treatment) Patient is a 59 y.o. female presenting with chest pain. The history is provided by the patient.  Chest Pain Pain location:  L chest Associated symptoms: no abdominal pain, no back pain, no headache, no nausea, no numbness, no shortness of breath, not vomiting and no weakness    patient developed an episode of crampy left-sided chest pain. Is on her left lower chest. She states it felt like contractions. No shortness of breath. No lightheadedness or dizziness. No nausea. She states she had some episodes like this in the past but didn't know what it was. His history of hypertension. States she may have had a small heart attack in the past. No adequate regulation. She is a former smoker and quit around a month ago. She feels much better now. No hemoptysis. No rash. No swelling or legs. No abdominal pain.  Past Medical History  Diagnosis Date  . Mental disorder   . Hypertension   . Depression    Past Surgical History  Procedure Laterality Date  . Tubal ligation Bilateral   . Abdominal hysterectomy      partial  . Cyst removed from spine     Family History  Problem Relation Age of Onset  . Breast cancer Mother   . Breast cancer Sister    History  Substance Use Topics  . Smoking status: Former Smoker -- 0.10 packs/day for 10 years    Types: Cigarettes  . Smokeless tobacco: Not on file  . Alcohol Use: No   OB History    Gravida Para Term Preterm AB TAB SAB Ectopic Multiple Living   2         2     Review of Systems  Constitutional: Negative for activity change and appetite change.  Eyes: Negative for pain.  Respiratory: Negative for chest tightness and shortness of breath.   Cardiovascular: Positive for  chest pain. Negative for leg swelling.  Gastrointestinal: Negative for nausea, vomiting, abdominal pain and diarrhea.  Genitourinary: Negative for flank pain.  Musculoskeletal: Negative for back pain and neck stiffness.  Skin: Negative for rash.  Neurological: Negative for weakness, numbness and headaches.  Psychiatric/Behavioral: Negative for behavioral problems.      Allergies  Sulfonamide derivatives and Tramadol hcl  Home Medications   Prior to Admission medications   Medication Sig Start Date End Date Taking? Authorizing Provider  hydrochlorothiazide (HYDRODIURIL) 25 MG tablet Take 25 mg by mouth daily.   Yes Historical Provider, MD  Multiple Vitamins-Minerals (MULTIPLE VITAMINS/WOMENS) tablet Take 1 tablet by mouth daily.   Yes Historical Provider, MD  Multiple Vitamins-Minerals (VITACEL) TABS Take 1 tablet by mouth daily.   Yes Historical Provider, MD  benzonatate (TESSALON) 100 MG capsule Take 1 capsule (100 mg total) by mouth 3 (three) times daily as needed for cough. Patient not taking: Reported on 12/29/2014 01/28/14   Ria Clock, PA  HYDROcodone-acetaminophen (NORCO) 5-325 MG per tablet Take 1-2 tablets by mouth every 4 (four) hours as needed for pain. Patient not taking: Reported on 12/29/2014 02/01/13   Arthor Captain, PA-C  ibuprofen (ADVIL,MOTRIN) 200 MG tablet Take 400 mg by mouth every 6 (six) hours as needed. For pain     Historical Provider, MD  ipratropium (ATROVENT)  0.06 % nasal spray Place 2 sprays into both nostrils 4 (four) times daily. Patient not taking: Reported on 12/29/2014 01/28/14   Mathis FareJennifer Lee H Presson, PA  loratadine (CLARITIN) 10 MG tablet Take 1 tablet (10 mg total) by mouth daily. Patient not taking: Reported on 12/29/2014 01/28/14   Ria ClockJennifer Lee H Presson, PA  meloxicam (MOBIC) 15 MG tablet Take 1 tablet (15 mg total) by mouth daily. Patient not taking: Reported on 12/29/2014 02/01/13   Arthor CaptainAbigail Harris, PA-C  naproxen sodium (ANAPROX) 220 MG  tablet Take 220 mg by mouth 2 (two) times daily with a meal.    Historical Provider, MD   BP 131/51 mmHg  Pulse 65  Temp(Src) 98.9 F (37.2 C) (Oral)  Resp 14  Ht 5\' 7"  (1.702 m)  Wt 230 lb (104.327 kg)  BMI 36.01 kg/m2  SpO2 97% Physical Exam  Constitutional: She appears well-developed and well-nourished.  HENT:  Head: Normocephalic and atraumatic.  Neck: Normal range of motion. Neck supple.  Cardiovascular: Normal rate, regular rhythm and normal heart sounds.   No murmur heard. Pulmonary/Chest: Effort normal and breath sounds normal. No respiratory distress. She has no wheezes. She has no rales. She exhibits no tenderness.  Abdominal: Soft. She exhibits no distension. There is no tenderness. There is no rebound and no guarding.  Musculoskeletal: Normal range of motion.  Neurological: She is alert.  Skin: Skin is warm and dry.  Psychiatric: She has a normal mood and affect. Her speech is normal.  Nursing note and vitals reviewed.   ED Course  Procedures (including critical care time) Labs Review Labs Reviewed  CBC WITH DIFFERENTIAL  I-STAT CHEM 8, ED  I-STAT TROPOININ, ED  I-STAT TROPOININ, ED    Imaging Review No results found.   EKG Interpretation   Date/Time:  Friday December 29 2014 09:30:20 EST Ventricular Rate:  63 PR Interval:  168 QRS Duration: 79 QT Interval:  408 QTC Calculation: 418 R Axis:   48 Text Interpretation:  Sinus rhythm Consider left atrial enlargement Low  voltage, precordial leads    MDM   Final diagnoses:  Chest pain    Patient with chest pain. EKg and labs reassuring. Will d'c with follow up    Juliet RudeNathan R. Rubin PayorPickering, MD 01/01/15 1322

## 2015-02-21 DIAGNOSIS — E785 Hyperlipidemia, unspecified: Secondary | ICD-10-CM | POA: Insufficient documentation

## 2015-02-21 DIAGNOSIS — R7301 Impaired fasting glucose: Secondary | ICD-10-CM | POA: Insufficient documentation

## 2015-02-21 DIAGNOSIS — G8929 Other chronic pain: Secondary | ICD-10-CM | POA: Insufficient documentation

## 2015-02-21 DIAGNOSIS — R7303 Prediabetes: Secondary | ICD-10-CM | POA: Insufficient documentation

## 2015-02-21 DIAGNOSIS — M545 Low back pain, unspecified: Secondary | ICD-10-CM | POA: Insufficient documentation

## 2015-05-29 ENCOUNTER — Ambulatory Visit: Payer: Medicaid Other | Admitting: Physical Therapy

## 2015-06-06 ENCOUNTER — Ambulatory Visit: Payer: Medicare Other | Attending: Orthopaedic Surgery

## 2015-06-06 DIAGNOSIS — M5380 Other specified dorsopathies, site unspecified: Secondary | ICD-10-CM | POA: Diagnosis not present

## 2015-06-06 DIAGNOSIS — R293 Abnormal posture: Secondary | ICD-10-CM | POA: Insufficient documentation

## 2015-06-06 DIAGNOSIS — R262 Difficulty in walking, not elsewhere classified: Secondary | ICD-10-CM | POA: Insufficient documentation

## 2015-06-06 DIAGNOSIS — M6281 Muscle weakness (generalized): Secondary | ICD-10-CM

## 2015-06-06 DIAGNOSIS — M2560 Stiffness of unspecified joint, not elsewhere classified: Secondary | ICD-10-CM

## 2015-06-06 DIAGNOSIS — M545 Low back pain: Secondary | ICD-10-CM | POA: Insufficient documentation

## 2015-06-06 NOTE — Therapy (Signed)
[place], [place], [address] Phone: [phone]   Fax:  [phone]  Physical Therapy Evaluation  Patient Details  Name: PAIZLIE KLAUS MRN: [id number] Date of Birth: 04-Jul-1956 Referring Provider:  Elbert Ewings, FNP  Encounter Date: 06/06/2015      PT End of Session - 06/06/15 1216    Visit Number 1   Number of Visits 12   Date for PT Re-Evaluation 07/18/15   Authorization Type Medicare KX at visit 15   Authorization Time Period 06/06/15/ to 8/3 16   Authorization - Visit Number 1   Authorization - Number of Visits 12   PT Start Time 1130   PT Stop Time 1215   PT Time Calculation (min) 45 min   Activity Tolerance Patient limited by pain   Behavior During Therapy [facility] for tasks assessed/performed      Past Medical History  Diagnosis Date  . Mental disorder   . Hypertension   . Depression     Past Surgical History  Procedure Laterality Date  . Tubal ligation Bilateral   . Abdominal hysterectomy      partial  . Cyst removed from spine      There were no vitals filed for this visit.  Visit Diagnosis:  Abnormal posture - Plan: PT plan of care cert/re-cert  Joint stiffness of spine - Plan: PT plan of care cert/re-cert  Weakness of trunk musculature - Plan: PT plan of care cert/re-cert  Right low back pain, with sciatica presence unspecified - Plan: PT plan of care cert/re-cert  Difficulty walking - Plan: PT plan of care cert/re-cert      Subjective Assessment - 06/06/15 1135    Subjective MD sent me here for back pain due to disc bulge. she has had chronic back pain and now inLT buttock and posterior LT thigh. Trying PT before considering other interventions.    Pertinent History She reports back pain for years. She reports leg pain for years also.   She has not been to PT before.    Limitations Sitting;Standing;Walking   How long can you sit comfortably? 15 min able to sit but  moves side to side   How long can you stand comfortably? 10 min not sure   How long can you walk comfortably? 200 feet.   shops 10-11min   Diagnostic tests MRI: 2014   Patient Stated Goals No pain   Currently in Pain? Yes   Pain Score 9    Pain Location Back   Pain Orientation Right;Left;Posterior   Pain Descriptors / Indicators Constant   Pain Type Chronic pain   Pain Radiating Towards LT hip/thigh   Pain Onset More than a month ago   Pain Frequency Constant   Aggravating Factors  Sitting/standing/walking / everything   Pain Relieving Factors Medication   Multiple Pain Sites No            [facility] PT Assessment - 06/06/15 1131    Assessment   Medical Diagnosis LBP with DDD   Onset Date/Surgical Date --  years ago   Prior Therapy Chiropractics with no benefit after 8 visits   Precautions   Precautions None   Restrictions   Weight Bearing Restrictions No   Balance Screen   Has the patient fallen in the past 6 months No   Prior Function   Level of Independence Independent   Cognition   Overall Cognitive Status Within Functional Limits for tasks assessed   Posture/Postural Control  Posture Comments bilaterla knee valgus ,increased lumbar lordosis,trunk sidebend /shift to RT    ROM / Strength   AROM / PROM / Strength AROM;Strength   AROM   Overall AROM Comments Hip range 5-10 degrees from normal RT  all with pain at end range. Lt hip ER 30 degres int rot 20 degrees adduction 10 and flexion 115 all with back pain elicited. Prone knee flexion to 90 degrees   AROM Assessment Site Lumbar   Lumbar Flexion she can touch her distal tivia but only does one side at a time   Lumbar Extension 5   Lumbar - Right Side Bend 5   Lumbar - Left Side Bend 5   Lumbar - Right Rotation 5   Lumbar - Left Rotation 0   Strength   Overall Strength Comments LE WNL   Flexibility   Soft Tissue Assessment /Muscle Length yes   Hamstrings 60 degrees bilaterally   Palpation   Palpation comment  Tenr in both gluteal and into lwer back to mid back.                              PT Short Term Goals - 06/06/15 1221    PT SHORT TERM GOAL #1   Title She will be independent with ainitial HEP   Time 3   Period Weeks   Status New   PT SHORT TERM GOAL #2   Title She will report 30% decrease pain  in back   Time 3   Period Weeks   Status New           PT Long Term Goals - 06/06/15 1222    PT LONG TERM GOAL #1   Title She will be independent with all HEP issued as of lst visit   Time 6   Period Weeks   Status New   PT LONG TERM GOAL #2   Title She will  report 505 decrease in pain in bak and hips   Time 6   Period Weeks   Status New   PT LONG TERM GOAL #3   Title she will report improved tolerance to sitting to 30 min without standing   Time 6   Period Weeks   Status New   PT LONG TERM GOAL #4   Title She will report being able to b on feet for 25 min or more to shop   Time [place] - 06/06/15 1217    Clinical Impression Statement She is stiff with muscle spasms and is pain dominent with pain level at 9/10 but able to move though with effort. LE strength good due to exercise program. We may be able to ease some pain with STW but due to chronicity unable to bve sure. Core strength poor   Pt will benefit from skilled therapeutic intervention in order to improve on the following deficits Difficulty walking;Pain;Increased muscle spasms;Decreased activity tolerance;Postural dysfunction;Decreased strength;Decreased range of motion   Rehab Potential Good   PT Frequency 2x / week   PT Duration 6 weeks  if improved at 3 weeks   PT Treatment/Interventions ADLs/Self Care Home Management;Electrical Stimulation;Dry needling;Passive range of motion;Taping;Manual techniques;Patient/family education;Therapeutic activities;Therapeutic exercise   PT Next Visit Plan STW and modalities, tape, stretching   Consulted and  Agree with Plan of Care Patient  G-Codes - 06/06/15 1317    Functional Limitation Other PT primary   Other PT Primary Current Status (H6314) At least 60 percent but less than 80 percent impaired, limited or restricted   Other PT Primary Goal Status (H7026) At least 40 percent but less than 60 percent impaired, limited or restricted       Problem List Patient Active Problem List   Diagnosis Date Noted  . APHTHOUS ULCERS 07/06/2007  . DYSPEPSIA, CHRONIC 07/06/2007  . RECTAL BLEEDING 07/06/2007  . ABSCESS, INNER THIGH 07/06/2007    Darrel Hoover PT 06/06/2015, 1:21 PM  [facility] [address] [place], [place], [address] Phone: [phone]   Fax:  [phone]

## 2015-06-12 ENCOUNTER — Ambulatory Visit: Payer: Medicare Other | Admitting: Physical Therapy

## 2015-06-14 ENCOUNTER — Ambulatory Visit: Payer: Medicare Other | Admitting: Physical Therapy

## 2015-06-14 DIAGNOSIS — R293 Abnormal posture: Secondary | ICD-10-CM | POA: Diagnosis not present

## 2015-06-14 DIAGNOSIS — R262 Difficulty in walking, not elsewhere classified: Secondary | ICD-10-CM

## 2015-06-14 DIAGNOSIS — M2560 Stiffness of unspecified joint, not elsewhere classified: Secondary | ICD-10-CM

## 2015-06-14 DIAGNOSIS — M545 Low back pain: Secondary | ICD-10-CM

## 2015-06-14 DIAGNOSIS — M6281 Muscle weakness (generalized): Secondary | ICD-10-CM

## 2015-06-14 NOTE — Therapy (Signed)
[place], [place], [address] Phone: [phone]   Fax:  [phone]  Physical Therapy Treatment  Patient Details  Name: Leah Baird MRN: [id number] Date of Birth: Dec 18, 1955 Referring Provider:  Elbert Ewings, FNP  Encounter Date: 06/14/2015      PT End of Session - 06/14/15 1535    Visit Number 2   Number of Visits 12   Date for PT Re-Evaluation 07/18/15   Authorization Type Medicare KX at visit 15   PT Start Time 1455   PT Stop Time 1545   PT Time Calculation (min) 50 min   Activity Tolerance Patient limited by pain      Past Medical History  Diagnosis Date  . Mental disorder   . Hypertension   . Depression     Past Surgical History  Procedure Laterality Date  . Tubal ligation Bilateral   . Abdominal hysterectomy      partial  . Cyst removed from spine      There were no vitals filed for this visit.  Visit Diagnosis:  Abnormal posture  Joint stiffness of spine  Weakness of trunk musculature  Right low back pain, with sciatica presence unspecified  Difficulty walking      Subjective Assessment - 06/14/15 1454    Subjective Not going too well.  I hurt really bad after the intial evaluation. Also had a bad headache.     Currently in Pain? Yes   Pain Score 8    Pain Location Back   Pain Orientation Left   Pain Type Chronic pain   Pain Radiating Towards left buttock   Aggravating Factors  all movements make me hurt   Pain Relieving Factors right sidelying                         [facility] Adult PT Treatment/Exercise - 06/14/15 1527    Lumbar Exercises: Standing   Other Standing Lumbar Exercises Left sidegliding  against wall 10x Patient reports worsening of symptoms   Lumbar Exercises: Sidelying   Other Sidelying Lumbar Exercises Right sidelying over pillow 3 min; patient reports worsening of symptoms   Lumbar Exercises: Prone   Other Prone Lumbar  Exercises prone lying over 1 pillow with discussion of centralization of symptoms   Modalities   Modalities Electrical Stimulation;Moist Heat   Moist Heat Therapy   Number Minutes Moist Heat 15 Minutes   Moist Heat Location Lumbar Spine   Electrical Stimulation   Electrical Stimulation Location Lumbar  prone over 1 pillow then right sidelying   Electrical Stimulation Action IFC   Electrical Stimulation Parameters 7.5 ma   Electrical Stimulation Goals Pain                PT Education - 06/14/15 1535    Education provided Yes   Education Details trial of prone lying and prone on elbows   Person(s) Educated Patient   Methods Explanation;Demonstration;Handout   Comprehension Verbalized understanding          PT Short Term Goals - 06/14/15 1702    PT SHORT TERM GOAL #1   Title She will be independent with ainitial HEP   Time 3   Period Weeks   Status On-going   PT SHORT TERM GOAL #2   Title She will report 30% decrease pain  in back   Time 3   Period Weeks   Status On-going  PT Long Term Goals - 06/14/15 1702    PT LONG TERM GOAL #1   Title She will be independent with all HEP issued as of lst visit   Time 6   Period Weeks   Status On-going   PT LONG TERM GOAL #2   Title She will  report 505 decrease in pain in bak and hips   Time 6   Period Weeks   Status On-going   PT LONG TERM GOAL #3   Title she will report improved tolerance to sitting to 30 min without standing   Time 6   Period Weeks   Status On-going   PT LONG TERM GOAL #4   Title She will report being able to b on feet for 25 min or more to shop   Time 6   Period Weeks   Status On-going               Plan - 06/14/15 1656    Clinical Impression Statement The patient was intolerant to movement and positioning.  No directional preference identified per history or movement assessment with sagittal and lateral directions.  Discussed centralization and a positive prognosis with  central symptoms vs. LE symptoms.  Although pain is no better following treatment session, she is standing more erect with level iliac crests and more symmetrical weight bearing.    PT Next Visit Plan assess response to home trial of prone lying over pillow; ? decompressive exercises; ? traction but may be unable to tolerate position; soft tissue work; tapine        Problem List Patient Active Problem List   Diagnosis Date Noted  . APHTHOUS ULCERS 07/06/2007  . DYSPEPSIA, CHRONIC 07/06/2007  . RECTAL BLEEDING 07/06/2007  . ABSCESS, INNER THIGH 07/06/2007    Alvera Singh 06/14/2015, 5:04 PM  [facility] [address] [place], [place], [address] Phone: [phone]   Fax:  [phone]   Ruben Im, PT 06/14/2015 5:04 PM Phone: [phone] Fax: [phone]

## 2015-06-26 ENCOUNTER — Ambulatory Visit: Payer: Medicare Other | Attending: Orthopaedic Surgery | Admitting: Physical Therapy

## 2015-06-26 DIAGNOSIS — R293 Abnormal posture: Secondary | ICD-10-CM | POA: Insufficient documentation

## 2015-06-26 DIAGNOSIS — R262 Difficulty in walking, not elsewhere classified: Secondary | ICD-10-CM | POA: Insufficient documentation

## 2015-06-26 DIAGNOSIS — M545 Low back pain: Secondary | ICD-10-CM | POA: Diagnosis present

## 2015-06-26 DIAGNOSIS — M6281 Muscle weakness (generalized): Secondary | ICD-10-CM

## 2015-06-26 DIAGNOSIS — M2560 Stiffness of unspecified joint, not elsewhere classified: Secondary | ICD-10-CM | POA: Insufficient documentation

## 2015-06-26 NOTE — Therapy (Signed)
[place], [place], [address] Phone: [phone]   Fax:  [phone]  Physical Therapy Treatment  Patient Details  Name: Leah Baird MRN: [id number] Date of Birth: May 02, 1956 Referring Provider:  Elbert Ewings, FNP  Encounter Date: 06/26/2015      PT End of Session - 06/26/15 1805    Visit Number 3   Number of Visits 12   Date for PT Re-Evaluation 07/18/15   PT Start Time 3295   PT Stop Time 1884   PT Time Calculation (min) 55 min   Activity Tolerance Patient limited by pain   Behavior During Therapy [facility] for tasks assessed/performed      Past Medical History  Diagnosis Date  . Mental disorder   . Hypertension   . Depression     Past Surgical History  Procedure Laterality Date  . Tubal ligation Bilateral   . Abdominal hysterectomy      partial  . Cyst removed from spine      There were no vitals filed for this visit.  Visit Diagnosis:  Abnormal posture  Joint stiffness of spine  Weakness of trunk musculature  Right low back pain, with sciatica presence unspecified      Subjective Assessment - 06/26/15 1555    Subjective Jammed toe 2 littlest Rt foot on Friday.  Has not seen the MD.  Tried prone lying over pillows.  Pain did centralize but pain increased in back.  She tried several times.    Currently in Pain? --   Pain Score 8    Pain Location Back   Pain Orientation Left   Pain Descriptors / Indicators Aching   Pain Radiating Towards Lt buttock   Aggravating Factors  moving   Pain Relieving Factors not sure                         [facility] Adult PT Treatment/Exercise - 06/26/15 1600    Lumbar Exercises: Stretches   Lower Trunk Rotation Limitations ver painful with initial attempt to move in bed.   Lumbar Exercises: Supine   AB Set Limitations transversus abdoninus work with breathing instruction,    Clam Limitations painful even with instruction   Bridge Limitations 1 rep too painful   Other Supine Lumbar Exercises decompression exercises attempted, wedge and pillows used.   Lumbar Exercises: Prone   Other Prone Lumbar Exercises quadratus lumborum over pillow stretch tried to see if pain reduced.     Cryotherapy   Number Minutes Cryotherapy 10 Minutes   Cryotherapy Location --  Hip/ back post session    Type of Cryotherapy --  cold pack   Manual Therapy   Manual therapy comments tries soft tissue work sidelying Rt over pillow to help pain, very light pressure only, no pain reduction.  So kinesiotex tape applied to low back pattern P and lateral hip and mid gluteal to create space.                    PT Short Term Goals - 06/14/15 1702    PT SHORT TERM GOAL #1   Title She will be independent with ainitial HEP   Time 3   Period Weeks   Status On-going   PT SHORT TERM GOAL #2   Title She will report 30% decrease pain  in back   Time 3   Period Weeks   Status On-going  PT Long Term Goals - 06/14/15 1702    PT LONG TERM GOAL #1   Title She will be independent with all HEP issued as of lst visit   Time 6   Period Weeks   Status On-going   PT LONG TERM GOAL #2   Title She will  report 505 decrease in pain in bak and hips   Time 6   Period Weeks   Status On-going   PT LONG TERM GOAL #3   Title she will report improved tolerance to sitting to 30 min without standing   Time 6   Period Weeks   Status On-going   PT LONG TERM GOAL #4   Title She will report being able to b on feet for 25 min or more to shop   Time 6   Period Weeks   Status On-going               Plan - 06/26/15 1807    Clinical Impression Statement Multiple exercises tried all painful and poorly tolerated.  Manual not helpful with soft tissue work or taping,  Pain increased post session.  I asked he to stay and get some cold packs to back to ease pain .  She said she was Numb when I asked her about her pain # post cold.   She was walking in a less guarded way post session.   PT Next Visit Plan Nustep        Problem List Patient Active Problem List   Diagnosis Date Noted  . APHTHOUS ULCERS 07/06/2007  . DYSPEPSIA, CHRONIC 07/06/2007  . RECTAL BLEEDING 07/06/2007  . ABSCESS, INNER THIGH 07/06/2007    Kyrel Leighton 06/26/2015, 6:13 PM  [facility] [address] [place], [place], [address] Phone: [phone]   Fax:  [phone]     Melvenia Needles, PTA 06/26/2015 6:13 PM Phone: [phone] Fax: [phone]

## 2015-06-28 ENCOUNTER — Ambulatory Visit: Payer: Medicare Other | Admitting: Physical Therapy

## 2015-07-03 ENCOUNTER — Ambulatory Visit: Payer: Medicare Other

## 2015-07-03 DIAGNOSIS — R293 Abnormal posture: Secondary | ICD-10-CM | POA: Diagnosis not present

## 2015-07-03 DIAGNOSIS — M545 Low back pain: Secondary | ICD-10-CM

## 2015-07-03 DIAGNOSIS — R262 Difficulty in walking, not elsewhere classified: Secondary | ICD-10-CM

## 2015-07-03 DIAGNOSIS — M6281 Muscle weakness (generalized): Secondary | ICD-10-CM

## 2015-07-03 DIAGNOSIS — M2560 Stiffness of unspecified joint, not elsewhere classified: Secondary | ICD-10-CM

## 2015-07-03 NOTE — Therapy (Signed)
[name], [place], [address] Phone: [phone]   Fax:  [phone]  Physical Therapy Treatment  Patient Details  Name: Leah Baird MRN: [id number] Date of Birth: [birth year] Referring Provider:  Elbert Ewings, FNP  Encounter Date: 07/03/2015      PT End of Session - 07/03/15 1644    Visit Number 4   Number of Visits 12   Date for PT Re-Evaluation 07/18/15   PT Start Time 0432   PT Stop Time 0525   PT Time Calculation (min) 53 min   Activity Tolerance Patient limited by pain   Behavior During Therapy [facility] for tasks assessed/performed      Past Medical History  Diagnosis Date  . Mental disorder   . Hypertension   . Depression     Past Surgical History  Procedure Laterality Date  . Tubal ligation Bilateral   . Abdominal hysterectomy      partial  . Cyst removed from spine      There were no vitals filed for this visit.  Visit Diagnosis:  Abnormal posture  Joint stiffness of spine  Weakness of trunk musculature  Right low back pain, with sciatica presence unspecified  Difficulty walking      Subjective Assessment - 07/03/15 1633    Subjective 8/10 today. I was rushed and got here 2 min late   Currently in Pain? Yes   Pain Score 8    Pain Location Back   Pain Orientation Left   Pain Descriptors / Indicators Aching   Pain Type Chronic pain   Pain Radiating Towards Lt buttock   Pain Onset More than a month ago   Pain Frequency Constant   Aggravating Factors  activity   Pain Relieving Factors nothing really   Multiple Pain Sites No                         [facility] Adult PT Treatment/Exercise - 07/03/15 1638    Exercises   Exercises Lumbar   Lumbar Exercises: Stretches   Single Knee to Chest Stretch 20 seconds;2 reps  RT and LT   Single Knee to Chest Stretch Limitations Needed Pt asssit to max stretch   Lower Trunk Rotation 10 seconds;5 reps   Pelvic  Tilt 5 reps  5 sec   Prone on Elbows Stretch 1 rep;60 seconds   Lumbar Exercises: Aerobic   Stationary Bike Nustep L3 5 min UE and LE   Moist Heat Therapy   Number Minutes Moist Heat 15 Minutes   Moist Heat Location Lumbar Spine   Manual Therapy   Manual Therapy Soft tissue mobilization;Myofascial release   Soft tissue mobilization With Rock blade to parspinals , gluteals , and flanks bilaterally with oscillations, deep pressure and glides.    Myofascial Release with hip rotation  pressure to lgluteals /piriformis                  PT Short Term Goals - 07/03/15 1712    PT SHORT TERM GOAL #1   Title She will be independent with ainitial HEP   Status On-going   PT SHORT TERM GOAL #2   Title She will report 30% decrease pain  in back   Status On-going           PT Long Term Goals - 06/14/15 1702    PT LONG TERM GOAL #1   Title She will be independent with all HEP issued  as of lst visit   Time 6   Period Weeks   Status On-going   PT LONG TERM GOAL #2   Title She will  report 505 decrease in pain in bak and hips   Time 6   Period Weeks   Status On-going   PT LONG TERM GOAL #3   Title she will report improved tolerance to sitting to 30 min without standing   Time 6   Period Weeks   Status On-going   PT LONG TERM GOAL #4   Title She will report being able to b on feet for 25 min or more to shop   Time 6   Period Weeks   Status On-going               Plan - 07/03/15 1710    Clinical Impression Statement Since she did not do well with exercise I decided to do more STW and this was painful but she tolerated this with rpeoirts of pain. i explained that she may have to put up with some pain to loosen muscles and make them more tolerant to stretch and pressur ethen ultimately make moveing easier and lesspainful   PT Next Visit Plan Cont nustep, STW and stretching.    Consulted and Agree with Plan of Care Patient        Problem List Patient Active  Problem List   Diagnosis Date Noted  . APHTHOUS ULCERS 07/06/2007  . DYSPEPSIA, CHRONIC 07/06/2007  . RECTAL BLEEDING 07/06/2007  . ABSCESS, INNER THIGH 07/06/2007    Darrel Hoover PT 07/03/2015, 5:14 PM  [facility] [address] [place], [place], [address] Phone: [phone]   Fax:  [phone]

## 2015-07-05 ENCOUNTER — Ambulatory Visit: Payer: Medicare Other | Admitting: Physical Therapy

## 2015-07-05 DIAGNOSIS — R293 Abnormal posture: Secondary | ICD-10-CM

## 2015-07-05 DIAGNOSIS — M2560 Stiffness of unspecified joint, not elsewhere classified: Secondary | ICD-10-CM

## 2015-07-05 DIAGNOSIS — M545 Low back pain: Secondary | ICD-10-CM

## 2015-07-05 DIAGNOSIS — R262 Difficulty in walking, not elsewhere classified: Secondary | ICD-10-CM

## 2015-07-05 DIAGNOSIS — M6281 Muscle weakness (generalized): Secondary | ICD-10-CM

## 2015-07-05 NOTE — Therapy (Addendum)
[place], [place], [address] Phone: [phone]   Fax:  [phone]  Physical Therapy Treatment  Patient Details  Name: ELISABEL HANOVER MRN: [id number] Date of Birth: 16-Apr-1956 Referring Provider:  Elbert Ewings, FNP  Encounter Date: 07/05/2015      PT End of Session - 07/05/15 1750    Visit Number 5   Number of Visits 12   Date for PT Re-Evaluation 07/18/15   Activity Tolerance Patient limited by pain   Behavior During Therapy [facility] for tasks assessed/performed      Past Medical History  Diagnosis Date  . Mental disorder   . Hypertension   . Depression     Past Surgical History  Procedure Laterality Date  . Tubal ligation Bilateral   . Abdominal hysterectomy      partial  . Cyst removed from spine      There were no vitals filed for this visit.  Visit Diagnosis:  Abnormal posture  Joint stiffness of spine  Weakness of trunk musculature  Right low back pain, with sciatica presence unspecified  Difficulty walking      Subjective Assessment - 07/05/15 1639    Subjective 8/10                          [facility] Adult PT Treatment/Exercise - 07/05/15 1645    Self-Care   Self-Care --  trialheel lift.Helped pain standing.Increase -walking    Posture Practiced posture with mirror.  when posture improved, pain increased (Lateral shift)   Lumbar Exercises: Stretches   Prone on Elbows Stretch --  1 rep, 30 seconds 3X  over 3 pillows   Lumbar Exercises: Sidelying   Other Sidelying Lumbar Exercises quadratus lumborum stretches over pillow 3 reps 30 seconds.   Lumbar Exercises: Prone   Other Prone Lumbar Exercises Multifitus 3 reps 3 sets   Cryotherapy   Number Minutes Cryotherapy 10 Minutes   Cryotherapy Location Lumbar Spine  HIP LT   Type of Cryotherapy --  cold pack                PT Education - 07/05/15 1750    Education provided Yes   Education  Details multifitus    Person(s) Educated Patient   Methods Explanation;Demonstration;Tactile cues;Verbal cues;Handout   Comprehension Verbalized understanding          PT Short Term Goals - 07/05/15 1752    PT SHORT TERM GOAL #1   Title She will be independent with ainitial HEP   Baseline multifitus issued today   Time 3   Period Weeks   Status Partially Met   PT SHORT TERM GOAL #2   Title She will report 30% decrease pain  in back   Baseline no decrease,  Pain incerases post PT.   Time 3   Period Weeks   Status On-going           PT Long Term Goals - 07/05/15 1753    PT LONG TERM GOAL #1   Title She will be independent with all HEP issued as of lst visit   Time 6   Status On-going   PT LONG TERM GOAL #2   Title She will  report 505 decrease in pain in bak and hips   Baseline 8/10   Time 6   Status On-going   PT LONG TERM GOAL #3   Title she will report improved tolerance to sitting to 30  min without standing   Time 6   Period Weeks   Status On-going   PT LONG TERM GOAL #4   Title She will report being able to b on feet for 25 min or more to shop   Time 6   Period Weeks   Status Unable to assess               Plan - 07/05/15 1751    Clinical Impression Statement Able to centralize pain with prone over 3 pillows with multifitus activation.  Added this to home ex.  Pain continues .        Problem List Patient Active Problem List   Diagnosis Date Noted  . APHTHOUS ULCERS 07/06/2007  . DYSPEPSIA, CHRONIC 07/06/2007  . RECTAL BLEEDING 07/06/2007  . ABSCESS, INNER THIGH 07/06/2007    HARRIS,KAREN 07/05/2015, 5:55 PM  [facility] [address] [place], [place], [address] Phone: [phone]   Fax:  [phone]     Melvenia Needles, PTA 07/05/2015 5:55 PM Phone: [phone] Fax: [phone] PHYSICAL THERAPY DISCHARGE SUMMARY  Visits from Start of Care: 5 Current functional level related  to goals / functional outcomes: Unknown   Remaining deficits: Unknown   Education / Equipment: HEP Plan:                                                    Patient goals were not met. Patient is being discharged due to not returning since the last visit.  ?????   Lillette Boxer Chasse PT   10/30/15                    2:28PM

## 2015-10-20 ENCOUNTER — Other Ambulatory Visit: Payer: Self-pay

## 2015-10-20 DIAGNOSIS — Z1231 Encounter for screening mammogram for malignant neoplasm of breast: Secondary | ICD-10-CM

## 2015-11-19 ENCOUNTER — Other Ambulatory Visit: Payer: Self-pay | Admitting: Orthopaedic Surgery

## 2015-11-19 DIAGNOSIS — M545 Low back pain: Secondary | ICD-10-CM

## 2015-11-27 ENCOUNTER — Other Ambulatory Visit: Payer: Self-pay | Admitting: Nurse Practitioner

## 2015-11-27 DIAGNOSIS — Z1231 Encounter for screening mammogram for malignant neoplasm of breast: Secondary | ICD-10-CM

## 2015-12-01 ENCOUNTER — Ambulatory Visit
Admission: RE | Admit: 2015-12-01 | Discharge: 2015-12-01 | Disposition: A | Discharge status: Home / Self Care | Payer: Medicare Other | Source: Ambulatory Visit | Attending: Orthopaedic Surgery | Admitting: Orthopaedic Surgery

## 2015-12-01 DIAGNOSIS — M545 Low back pain: Secondary | ICD-10-CM

## 2016-01-18 ENCOUNTER — Ambulatory Visit: Payer: Medicare Other

## 2016-01-21 ENCOUNTER — Ambulatory Visit
Admission: RE | Admit: 2016-01-21 | Discharge: 2016-01-21 | Disposition: A | Discharge status: Home / Self Care | Payer: Medicare Other | Source: Ambulatory Visit | Attending: Nurse Practitioner | Admitting: Nurse Practitioner

## 2016-01-21 DIAGNOSIS — Z1231 Encounter for screening mammogram for malignant neoplasm of breast: Secondary | ICD-10-CM

## 2016-07-11 ENCOUNTER — Emergency Department (HOSPITAL_COMMUNITY)
Admission: EM | Admit: 2016-07-11 | Discharge: 2016-07-11 | Disposition: A | Discharge status: Home / Self Care | Payer: Medicare Other | Attending: Emergency Medicine | Admitting: Emergency Medicine

## 2016-07-11 ENCOUNTER — Encounter (HOSPITAL_COMMUNITY): Payer: Self-pay | Admitting: *Deleted

## 2016-07-11 DIAGNOSIS — I10 Essential (primary) hypertension: Secondary | ICD-10-CM | POA: Diagnosis not present

## 2016-07-11 DIAGNOSIS — Z87891 Personal history of nicotine dependence: Secondary | ICD-10-CM | POA: Diagnosis not present

## 2016-07-11 DIAGNOSIS — J029 Acute pharyngitis, unspecified: Secondary | ICD-10-CM | POA: Diagnosis not present

## 2016-07-11 LAB — RAPID STREP SCREEN (MED CTR MEBANE ONLY): STREPTOCOCCUS, GROUP A SCREEN (DIRECT): NEGATIVE

## 2016-07-11 MED ORDER — HYDROCODONE-ACETAMINOPHEN 5-325 MG PO TABS
2.0000 | ORAL_TABLET | ORAL | 0 refills | Status: AC | PRN
Start: 2016-07-11 — End: ?

## 2016-07-11 MED ORDER — MAGIC MOUTHWASH: 5.0000 mL | Freq: Three times a day (TID) | ORAL | 0 refills | Status: DC | PRN

## 2016-07-11 MED ORDER — DEXAMETHASONE SODIUM PHOSPHATE 10 MG/ML IJ SOLN
10.0000 mg | Freq: Once | INTRAMUSCULAR | Status: AC
  Administered 2016-07-11: 10 mg via INTRAMUSCULAR
  Filled 2016-07-11: qty 1

## 2016-07-11 MED ORDER — LIDOCAINE VISCOUS 2 % MT SOLN
15.0000 mL | Freq: Once | OROMUCOSAL | Status: AC
  Administered 2016-07-11: 15 mL via OROMUCOSAL
  Filled 2016-07-11: qty 15

## 2016-07-11 NOTE — ED Provider Notes (Signed)
Emergency Department Provider Note   I have reviewed the triage vital signs and the nursing notes.   HISTORY  Chief Complaint Sore Throat   HPI ALEYSA Baird is a 60 y.o. female with PMH of HTN and depression s/p tonsillectomy presents to the emergency department for evaluation of 1 week of painful swallowing. No associated fever or shaking chills. She does not have difficulty swallowing solids or liquids except for the fact that it is very painful. She reports this happened 5 or 6 times in the past. She has not received a definitive diagnosis. In the past she's been given topical pain medications with some relief. She is here today to find out the underlying etiology of her problem. She reports onset of symptoms around the time that her tonsils were removed. She reports this was over a year ago and since that time has had this happen 5-6 times. No chest pain or difficulty breathing. No abdominal pain, vomiting, or diarrhea. No neck stiffness or pain.    Past Medical History:  Diagnosis Date  . Depression   . Hypertension   . Mental disorder     Patient Active Problem List   Diagnosis Date Noted  . APHTHOUS ULCERS 07/06/2007  . DYSPEPSIA, CHRONIC 07/06/2007  . RECTAL BLEEDING 07/06/2007  . ABSCESS, INNER THIGH 07/06/2007    Past Surgical History:  Procedure Laterality Date  . ABDOMINAL HYSTERECTOMY     partial  . cyst removed from spine    . TUBAL LIGATION Bilateral     Current Outpatient Rx  . Order #: [id number] Class: Print  . Order #: [id number] Class: Historical Med  . Order #: [id number] Class: Print  . Order #: [id number] Class: Historical Med  . Order #: [id number] Class: Print  . Order #: [id number] Class: Print  . Order #: [id number] Class: Print  . Order #: [id number] Class: Historical Med  . Order #: [id number] Class: Historical Med  . Order #: [id number] Class: Historical Med    Allergies Sulfonamide derivatives and Tramadol hcl  Family History  Problem Relation Age of  Onset  . Breast cancer Mother   . Breast cancer Sister     Social History Social History  Substance Use Topics  . Smoking status: Former Smoker    Packs/day: 0.10    Years: 10.00    Types: Cigarettes  . Smokeless tobacco: Not on file  . Alcohol use No    Review of Systems  Constitutional: No fever/chills Eyes: No visual changes. ENT: Positive sore throat. Cardiovascular: Denies chest pain. Respiratory: Denies shortness of breath. Gastrointestinal: No abdominal pain.  No nausea, no vomiting.  No diarrhea.  No constipation. Genitourinary: Negative for dysuria. Musculoskeletal: Negative for back pain. Skin: Negative for rash. Neurological: Negative for headaches, focal weakness or numbness.  10-point ROS otherwise negative.  ____________________________________________   PHYSICAL EXAM:  VITAL SIGNS: ED Triage Vitals  Enc Vitals Group     BP 07/11/16 1337 173/84     Pulse Rate 07/11/16 1337 72     Resp 07/11/16 1337 16     Temp 07/11/16 1337 99.2 F (37.3 C)     Temp Source 07/11/16 1337 Oral     SpO2 07/11/16 1337 97 %     Pain Score 07/11/16 1343 10    Constitutional: Alert and oriented. Well appearing and in no acute distress. Eyes: Conjunctivae are normal. PERRL. EOMI. Head: Atraumatic. Ears:  Healthy appearing ear canals and TMs bilaterally. No auditory canal lesions.  Nose: No congestion/rhinnorhea. Mouth/Throat: Mucous membranes are moist.  Oropharynx  is erythematous without exudate. Hard palate with herpangina lesions and shallow ulcerations. No tongue swelling or trismus. Speaking is normal tone and managing oral secretions.  Neck: No stridor.  No meningeal signs. Cardiovascular: Normal rate, regular rhythm. Good peripheral circulation. Grossly normal heart sounds.   Respiratory: Normal respiratory effort.  No retractions. Lungs CTAB. Gastrointestinal: Soft and nontender. No distention.  Musculoskeletal: No lower extremity tenderness nor edema. No gross  deformities of extremities. Neurologic:  Normal speech and language. No gross focal neurologic deficits are appreciated.  Skin:  Skin is warm, dry and intact. No rash noted. Psychiatric: Mood and affect are normal. Speech and behavior are normal.  ____________________________________________  RADIOLOGY  None ____________________________________________   PROCEDURES  Procedure(s) performed:   Procedures  None ____________________________________________   INITIAL IMPRESSION / ASSESSMENT AND PLAN / ED COURSE  Pertinent labs & imaging results that were available during my care of the patient were reviewed by me and considered in my medical decision making (see chart for details).  Patient resents to the emergency department for evaluation of sore throat. The patient's airway is patent. Tongue is normal size with soft floor of the mouth. Trace anterior cervical adenopathy. Patient has ulcerations to the posterior pharynx. No evidence of angioedema or impending airway compromise. Patient has no exudate. Tonsils have been removed. No fever or other sign of bacterial pharyngitis. Plan for symptomatic management with lidocaine gargle in the emergency department along with intramuscular Decadron. We'll discharge home with small amount of Vicodin and Magic mouthwash. Patient will follow with her primary care physician in the next 2-3 days or return to the emergency department sooner if she begins to have difficulty breathing, swallowing, high fevers, or other concerning symptoms which we discussed in detail.    ____________________________________________  FINAL CLINICAL IMPRESSION(S) / ED DIAGNOSES  Final diagnoses:  Sore throat     MEDICATIONS GIVEN DURING THIS VISIT:  Medications  lidocaine (XYLOCAINE) 2 % viscous mouth solution 15 mL (15 mLs Mouth/Throat Given 07/11/16 1755)  dexamethasone (DECADRON) injection 10 mg (10 mg Intramuscular Given 07/11/16 1753)     NEW OUTPATIENT  MEDICATIONS STARTED DURING THIS VISIT:  New Prescriptions   HYDROCODONE-ACETAMINOPHEN (NORCO/VICODIN) 5-325 MG TABLET    Take 2 tablets by mouth every 4 (four) hours as needed.   MAGIC MOUTHWASH SOLN    Take 5 mLs by mouth 3 (three) times daily as needed for mouth pain.      Note:  This document was prepared using Dragon voice recognition software and may include unintentional dictation errors.  Alona Bene, MD Emergency Medicine   Maia Plan, MD 07/11/16 [phone]

## 2016-07-11 NOTE — Discharge Instructions (Signed)
You were seen in the ED today with sore throat. We are discharging you home with medication for pain. Follow up with your PCP in the coming week to review this recurring problem with you.   Return to the ED with any high fever, inability to swallow, or difficulty breathing.

## 2016-07-11 NOTE — ED Triage Notes (Signed)
Pt reports sore throat and bilateral ear pain x 1 week. Has pain to roof her mouth and feels like throat is swollen. Has severe pain when swallowing and difficulty talking.

## 2016-07-14 LAB — CULTURE, GROUP A STREP (THRC)

## 2016-12-17 ENCOUNTER — Ambulatory Visit (INDEPENDENT_AMBULATORY_CARE_PROVIDER_SITE_OTHER): Payer: Medicare Other | Admitting: Orthopaedic Surgery

## 2016-12-17 ENCOUNTER — Encounter (INDEPENDENT_AMBULATORY_CARE_PROVIDER_SITE_OTHER): Payer: Self-pay | Admitting: Orthopaedic Surgery

## 2016-12-17 ENCOUNTER — Ambulatory Visit (INDEPENDENT_AMBULATORY_CARE_PROVIDER_SITE_OTHER): Payer: Medicare Other

## 2016-12-17 VITALS — BP 143/72 | HR 62 | Ht 67.5 in | Wt 245.0 lb

## 2016-12-17 DIAGNOSIS — M25511 Pain in right shoulder: Secondary | ICD-10-CM | POA: Diagnosis not present

## 2016-12-17 MED ORDER — BUPIVACAINE HCL 0.25 % IJ SOLN
4.0000 mL | INTRAMUSCULAR | Status: AC | PRN
  Administered 2016-12-17: 4 mL via INTRA_ARTICULAR

## 2016-12-17 MED ORDER — LIDOCAINE HCL 1 % IJ SOLN
1.0000 mL | INTRAMUSCULAR | Status: AC | PRN
  Administered 2016-12-17: 1 mL

## 2016-12-17 MED ORDER — METHYLPREDNISOLONE ACETATE 40 MG/ML IJ SUSP
40.0000 mg | INTRAMUSCULAR | Status: AC | PRN
  Administered 2016-12-17: 40 mg via INTRA_ARTICULAR

## 2016-12-17 NOTE — Progress Notes (Signed)
Office Visit Note   Patient: Leah Baird           Date of Birth: 23-Nov-1956           MRN: [id number] Visit Date: 12/17/2016              Requested by: Leah LodgeAnthony Steele, FNP [address] [place], [place] [id number] PCP: Leah LodgeAnthony Steele, FNP   Assessment & Plan: Visit Diagnoses:  1. Right shoulder pain, unspecified chronicity     Plan: Right shoulder some acromial injection performed. She has persistent problems she will return. Patient requested the referral to a neurologist due to the slow gradual progressive memory loss.  Follow-Up Instructions: No Follow-up on file.   Orders:  Orders Placed This Encounter  Procedures  . XR Shoulder Right   No orders of the defined types were placed in this encounter.     Procedures: Large Joint Inj Date/Time: 12/17/2016 11:14 AM Performed by: Eldred MangesYATES, Wrenly Lauritsen C Authorized by: Eldred MangesYATES, Hakim Minniefield C   Consent Given by:  Patient Indications:  Pain Location:  Shoulder Site:  R subacromial bursa Needle Size:  22 G Needle Length:  1.5 inches Ultrasound Guidance: No   Fluoroscopic Guidance: No   Arthrogram: No   Medications:  1 mL lidocaine 1 %; 40 mg methylPREDNISolone acetate 40 MG/ML; 4 mL bupivacaine 0.25 % Patient tolerance:  Patient tolerated the procedure well with no immediate complications     Clinical Data: No additional findings.   Subjective: Chief Complaint  Patient presents with  . Right Shoulder - Pain    Patient here with complaint of right shoulder pain x 5 months. It is not getting better. She denies any injury, however, she did have to sleep on her right side a lot due to the pain she had in her left hip. She states that the right shoulder hurts like her hip did. She has decreased range of motion, cannot raise her arm or put it behind her back. She will sometimes make a sling out of an ace wrap to keep it from moving.  She uses Naproxen, rubs, etc. She states nothing seems to help.    Review of Systems    Constitutional: Negative for chills and diaphoresis.  HENT: Negative for ear discharge, ear pain and nosebleeds.   Eyes: Negative for discharge and visual disturbance.  Respiratory: Negative for cough, choking and shortness of breath.   Cardiovascular: Negative for chest pain and palpitations.  Gastrointestinal: Negative for abdominal distention and abdominal pain.  Endocrine: Negative for cold intolerance and heat intolerance.  Genitourinary: Negative for flank pain and hematuria.  Musculoskeletal:       Positive for  back pain and right shoulder pain.  Skin: Negative for rash and wound.  Neurological: Negative for seizures and speech difficulty.       Positive for forgetfulness.  Hematological: Negative for adenopathy. Does not bruise/bleed easily.  Psychiatric/Behavioral: Negative for agitation and suicidal ideas.     Objective: Vital Signs: BP (!) 143/72   Pulse 62   Ht 5' 7.5" (1.715 m)   Wt 245 lb (111.1 kg)   BMI 37.81 kg/m   Physical Exam  Constitutional: She is oriented to person, place, and time. She appears well-developed.  HENT:  Head: Normocephalic.  Right Ear: External ear normal.  Left Ear: External ear normal.  Eyes: Pupils are equal, round, and reactive to light.  Neck: No tracheal deviation present. No thyromegaly present.  Cardiovascular: Normal rate.   Pulmonary/Chest: Effort normal.  Abdominal: Soft.  Musculoskeletal:  Positive impingement right shoulder. She can only abduct to 90 on the right left arm she get overhead reflexes are 2+. Pain with internal rotation right shoulder chest long of the biceps is painful she jumps withdraws and moans with the exam. Negative lateral sulcus sign no anterior instability. No limitation of external rotation. Elbow reaches full extension hand and wrist is normal. Gait shows very slow short stride gait.  Neurological: She is alert and oriented to person, place, and time.  Skin: Skin is warm and dry.  Psychiatric: She  has a normal mood and affect. Her behavior is normal.    Ortho Exam  Specialty Comments:  No specialty comments available.  Imaging: No results found.   PMFS History: Patient Active Problem List   Diagnosis Date Noted  . Right shoulder pain 12/17/2016  . APHTHOUS ULCERS 07/06/2007  . DYSPEPSIA, CHRONIC 07/06/2007  . RECTAL BLEEDING 07/06/2007  . ABSCESS, INNER THIGH 07/06/2007   Past Medical History:  Diagnosis Date  . Depression   . Hypertension   . Mental disorder     Family History  Problem Relation Age of Onset  . Breast cancer Mother   . Breast cancer Sister     Past Surgical History:  Procedure Laterality Date  . ABDOMINAL HYSTERECTOMY     partial  . cyst removed from spine    . TUBAL LIGATION Bilateral    Social History   Occupational History  . Not on file.   Social History Main Topics  . Smoking status: Former Smoker    Packs/day: 0.10    Years: 10.00    Types: Cigarettes  . Smokeless tobacco: Not on file  . Alcohol use No  . Drug use: No  . Sexual activity: No

## 2017-04-01 ENCOUNTER — Telehealth (INDEPENDENT_AMBULATORY_CARE_PROVIDER_SITE_OTHER): Payer: Self-pay | Admitting: *Deleted

## 2017-04-01 DIAGNOSIS — Z8669 Personal history of other diseases of the nervous system and sense organs: Secondary | ICD-10-CM

## 2017-04-01 NOTE — Telephone Encounter (Signed)
Referral entered. I left voicemail for patient advising someone would call her with appt date and time.

## 2017-04-01 NOTE — Telephone Encounter (Signed)
Ok for referral? It was mentioned in last office note, but I do not see where it was entered.

## 2017-04-01 NOTE — Telephone Encounter (Signed)
Yes. Ok    thanks

## 2017-04-01 NOTE — Telephone Encounter (Signed)
Patient called in this morning in regards to getting a referral to a neurologist? She stated that her and Dr. Ophelia Charter had discussed her going to see a neurologist for her severe headaches and migraines. I did not see a referral in her chart. Her CB # [phone]) [id number]. Thank you

## 2017-04-07 ENCOUNTER — Other Ambulatory Visit: Payer: Self-pay | Admitting: Neurology

## 2017-04-07 ENCOUNTER — Encounter (INDEPENDENT_AMBULATORY_CARE_PROVIDER_SITE_OTHER): Payer: Self-pay

## 2017-04-07 ENCOUNTER — Encounter: Payer: Self-pay | Admitting: Neurology

## 2017-04-07 ENCOUNTER — Ambulatory Visit (INDEPENDENT_AMBULATORY_CARE_PROVIDER_SITE_OTHER): Payer: Medicare Other | Admitting: Neurology

## 2017-04-07 VITALS — BP 150/68 | HR 58 | Ht 67.5 in | Wt 251.6 lb

## 2017-04-07 DIAGNOSIS — R55 Syncope and collapse: Secondary | ICD-10-CM | POA: Diagnosis not present

## 2017-04-07 DIAGNOSIS — G43009 Migraine without aura, not intractable, without status migrainosus: Secondary | ICD-10-CM

## 2017-04-07 DIAGNOSIS — G44301 Post-traumatic headache, unspecified, intractable: Secondary | ICD-10-CM

## 2017-04-07 MED ORDER — TOPIRAMATE 50 MG PO TABS: 50.0000 mg | ORAL_TABLET | Freq: Two times a day (BID) | ORAL | 3 refills | Status: AC

## 2017-04-07 MED ORDER — TOPIRAMATE ER 100 MG PO CAP24: 1.0000 | ORAL_CAPSULE | ORAL | 3 refills | Status: DC

## 2017-04-07 NOTE — Progress Notes (Signed)
[facility] [address] [place]. [place] [address] [phone]       OFFICE CONSULT NOTE  Leah. Leah Baird Date of Birth:  March 27, 1956 Medical Record Number:  [id number]   Referring MD:  Levander Campion  Reason for Referral:  headache  HPI: Leah Baird is a 89 year African American lady who states she has had migraines as well as passing out episodes since her teenage years. She describes the headache has been throbbing in nature 10/10 in severity. Accompanied by light and sound sensitivity and nausea. They may last for hours occasionally even days. She is unable to work and has to lie down and sleep for relief.raine excedrin She used to get good relief with Cafergot but in recent years has been taking migraine excedrin with good success. She feels the headache began when she fell and hit the back of her head and may have  fractured her skull. She did not seek medical help at that time.She outgrew her headaches after he 10 years but then the headaches have since returned.They were not as frequent but in recent few months they occur allmost daily. She is unable to identify specific triggers. She does admit to having posterior neck pain and muscle tightness She states she's never seen a neurologist and has not had any brain imaging studies performed so far.. She has not tried medications like  triptans  or any headache prophylactic medicines. She states she also has brief episodes of passing out with most of these headaches and she considers these as being part of her migraines.She has not had cardiac work up or EEG study done either. She does admit to snoring and  feels she may have sleep apnea but she has not been evaluated for that yet.. She does have h/o of chronic low back pain and takes vicodin and gabapentin as needed for her pain. She's also been bothered by recent recurrent ear infections and tinnitus.she denies f/h/o migraines or seizures  ROS:   14 system review of  systems is positive for weight gain, fever., chills, fatigue, murmur or, leg swelling, ringing in the ears, atrial, blurred vision, double vision, eye pain, shortness of breath, snoring, urination problems, easy , joint pain, aching muscles, headache, numbness, weakness, dizziness, passing out, anxiety, depression, snoring or restless legs, change in appetite, disinterest in activities   PMH:  Past Medical History:  Diagnosis Date  . Depression   . Hypertension   . Mental disorder     Social History:  Social History   Social History  . Marital status: Widowed    Spouse name: N/A  . Number of children: N/A  . Years of education: N/A   Occupational History  . Not on file.   Social History Main Topics  . Smoking status: Former Smoker    Packs/day: 0.10    Years: 10.00    Types: Cigarettes  . Smokeless tobacco: Never Used  . Alcohol use No  . Drug use: No  . Sexual activity: No   Other Topics Concern  . Not on file   Social History Narrative  . No narrative on file    Medications:   Current Outpatient Prescriptions on File Prior to Visit  Medication Sig Dispense Refill  . baclofen (LIORESAL) 10 MG tablet Take by mouth.    . benzonatate (TESSALON) 100 MG capsule Take 1 capsule (100 mg total) by mouth 3 (three) times daily as needed for cough. 21 capsule 0  . BIOTIN PO Take  by mouth.    . gabapentin (NEURONTIN) 300 MG capsule Take by mouth.    . hydrochlorothiazide (HYDRODIURIL) 25 MG tablet Take 25 mg by mouth daily.    Marland Kitchen HYDROcodone-acetaminophen (NORCO/VICODIN) 5-325 MG tablet Take 2 tablets by mouth every 4 (four) hours as needed. 10 tablet 0  . ibuprofen (ADVIL,MOTRIN) 200 MG tablet Take 400 mg by mouth every 6 (six) hours as needed. For pain     . ipratropium (ATROVENT) 0.06 % nasal spray Place 2 sprays into both nostrils 4 (four) times daily. 15 mL 1  . loratadine (CLARITIN) 10 MG tablet Take 1 tablet (10 mg total) by mouth daily. 30 tablet 1  . magic mouthwash  SOLN Take 5 mLs by mouth 3 (three) times daily as needed for mouth pain. 15 mL 0  . meloxicam (MOBIC) 15 MG tablet Take 1 tablet (15 mg total) by mouth daily. 10 tablet 0  . Multiple Vitamins-Minerals (MULTIPLE VITAMINS/WOMENS) tablet Take 1 tablet by mouth daily.    . Multiple Vitamins-Minerals (VITACEL) TABS Take 1 tablet by mouth daily.    . naproxen sodium (ANAPROX) 220 MG tablet Take 220 mg by mouth 2 (two) times daily with a meal.    . simvastatin (ZOCOR) 10 MG tablet Take 10 mg by mouth.    Marland Kitchen VITAMIN A PO Take by mouth.    . vitamin B-12 (CYANOCOBALAMIN) 100 MCG tablet Take by mouth.    Marland Kitchen VITAMIN D, CHOLECALCIFEROL, PO Take by mouth.    Marland Kitchen VITAMIN E PO Take by mouth.     No current facility-administered medications on file prior to visit.     Allergies:   Allergies  Allergen Reactions  . Sulfonamide Derivatives Hives  . Tramadol Hcl     REACTION: Hives    Physical Exam General: well developed, well nourished, seated, in no evident distress Head: head normocephalic and atraumatic.   Neck: supple with no carotid or supraclavicular bruits Cardiovascular: regular rate and rhythm, no murmurs Musculoskeletal: no deformity. Mild tightness of posterior neck muscles. Skin:  no rash/petichiae Vascular:  Normal pulses all extremities  Neurologic Exam Mental Status: Awake and fully alert. Oriented to place and time. Recent and remote memory intact. Attention span, concentration and fund of knowledge appropriate. Mood and affect appropriate.  Cranial Nerves: Fundoscopic exam reveals sharp disc margins. Pupils equal, briskly reactive to light. Extraocular movements full without nystagmus. Visual fields full to confrontation. Hearing intact. Facial sensation intact. Face, tongue, palate moves normally and symmetrically.  Motor: Normal bulk and tone. Normal strength in all tested extremity muscles. Sensory.: intact to touch , pinprick , position and vibratory sensation.  Coordination: Rapid  alternating movements normal in all extremities. Finger-to-nose and heel-to-shin performed accurately bilaterally. Gait and Station: Arises from chair without difficulty. Stance is normal. Gait demonstrates normal stride length and balance . Able to heel, toe and tandem walk without difficulty.  Reflexes: 1+ and symmetric. Toes downgoing.       ASSESSMENT: 48 year lady with long standing h/o migraine headaches with passing out spells with recent increase in headache frequency with likely component of analgesic rebound.    PLAN: I had a long discussion with the patient regarding her migraine headaches which appear to have increased in frequency and severity as well as recurrent episodes of passing out and answered questions and discussed plan for evaluation and treatment. I recommend trial of Trokendi XR ( topiramate ER) 100 mg daily for migraine prophylaxis. I advised her to limit using migraine Excedrin  for not more than 3 days per week to avoid analgesic rebound. I also encouraged her to do regular neck stretching exercises and to participate in activities for stress laxation like regular walking, meditation and yoga. Check MRI scan of the brain, MRA of the brain and neck and EEG study. She was advised to maintain a headache dairy and to avoid any known headache triggers if possible. Greater than 50 % time during this 45 minute consultation visit was spent on counselling and coordination of care about her migraines, passing out spells , discussion of evaluation and treatment plan and answering questions.She will return for follow-up in 2 months or call earlier if necessary. Delia Heady, MD  [facility] [address] [place], [place] [address]  Phone [phone] Fax [phone] Note: This document was prepared with digital dictation and possible smart phrase technology. Any transcriptional errors that result from this process are unintentional.

## 2017-04-07 NOTE — Patient Instructions (Signed)
I had a long discussion with the patient regarding her migraine headaches which appear to have increased in frequency and severity as well as recurrent episodes of passing out and answered questions and discussed plan for evaluation and treatment. I recommend trial of Trokendi XR ( topiramate ER) 100 mg daily for migraine prophylaxis. I advised her to limit using migraine Excedrin for not more than 3 days per week to avoid analgesic rebound. I also encouraged her to do regular neck stretching exercises and to participate in activities for stress laxation like regular walking, meditation and yoga. Check MRI scan of the brain, MRA of the brain and neck and EEG study. She was advised to maintain a headache dairy and to avoid any known headache triggers if possible. She will return for follow-up in 2 months or call earlier if necessary.   Migraine Headache A migraine headache is a very strong throbbing pain on one side or both sides of your head. Migraines can also cause other symptoms. Talk with your doctor about what things may bring on (trigger) your migraine headaches. Follow these instructions at home: Medicines   Take over-the-counter and prescription medicines only as told by your doctor.  Do not drive or use heavy machinery while taking prescription pain medicine.  To prevent or treat constipation while you are taking prescription pain medicine, your doctor may recommend that you:  Drink enough fluid to keep your pee (urine) clear or pale yellow.  Take over-the-counter or prescription medicines.  Eat foods that are high in fiber. These include fresh fruits and vegetables, whole grains, and beans.  Limit foods that are high in fat and processed sugars. These include fried and sweet foods. Lifestyle   Avoid alcohol.  Do not use any products that contain nicotine or tobacco, such as cigarettes and e-cigarettes. If you need help quitting, ask your doctor.  Get at least 8 hours of sleep  every night.  Limit your stress. General instructions    Keep a journal to find out what may bring on your migraines. For example, write down:  What you eat and drink.  How much sleep you get.  Any change in what you eat or drink.  Any change in your medicines.  If you have a migraine:  Avoid things that make your symptoms worse, such as bright lights.  It may help to lie down in a dark, quiet room.  Do not drive or use heavy machinery.  Ask your doctor what activities are safe for you.  Keep all follow-up visits as told by your doctor. This is important. Contact a doctor if:  You get a migraine that is different or worse than your usual migraines. Get help right away if:  Your migraine gets very bad.  You have a fever.  You have a stiff neck.  You have trouble seeing.  Your muscles feel weak or like you cannot control them.  You start to lose your balance a lot.  You start to have trouble walking.  You pass out (faint). This information is not intended to replace advice given to you by your health care provider. Make sure you discuss any questions you have with your health care provider. Document Released: 09/09/2008 Document Revised: 06/20/2016 Document Reviewed: 05/19/2016 Elsevier Interactive Patient Education  2017 [employer] Inc.  Neck Exercises Neck exercises can be important for many reasons:  They can help you to improve and maintain flexibility in your neck. This can be especially important as you age.  They  can help to make your neck stronger. This can make movement easier.  They can reduce or prevent neck pain.  They may help your upper back. Ask your health care provider which neck exercises would be best for you. Exercises Neck Press  Repeat this exercise 10 times. Do it first thing in the morning and right before bed or as told by your health care provider. 1. Lie on your back on a firm bed or on the floor with a pillow under your  head. 2. Use your neck muscles to push your head down on the pillow and straighten your spine. 3. Hold the position as well as you can. Keep your head facing up and your chin tucked. 4. Slowly count to 5 while holding this position. 5. Relax for a few seconds. Then repeat. Isometric Strengthening  Do a full set of these exercises 2 times a day or as told by your health care provider. 1. Sit in a supportive chair and place your hand on your forehead. 2. Push forward with your head and neck while pushing back with your hand. Hold for 10 seconds. 3. Relax. Then repeat the exercise 3 times. 4. Next, do thesequence again, this time putting your hand against the back of your head. Use your head and neck to push backward against the hand pressure. 5. Finally, do the same exercise on either side of your head, pushing sideways against the pressure of your hand. Prone Head Lifts  Repeat this exercise 5 times. Do this 2 times a day or as told by your health care provider. 1. Lie face-down, resting on your elbows so that your chest and upper back are raised. 2. Start with your head facing downward, near your chest. Position your chin either on or near your chest. 3. Slowly lift your head upward. Lift until you are looking straight ahead. Then continue lifting your head as far back as you can stretch. 4. Hold your head up for 5 seconds. Then slowly lower it to your starting position. Supine Head Lifts  Repeat this exercise 8-10 times. Do this 2 times a day or as told by your health care provider. 1. Lie on your back, bending your knees to point to the ceiling and keeping your feet flat on the floor. 2. Lift your head slowly off the floor, raising your chin toward your chest. 3. Hold for 5 seconds. 4. Relax and repeat. Scapular Retraction  Repeat this exercise 5 times. Do this 2 times a day or as told by your health care provider. 1. Stand with your arms at your sides. Look straight ahead. 2. Slowly  pull both shoulders backward and downward until you feel a stretch between your shoulder blades in your upper back. 3. Hold for 10-30 seconds. 4. Relax and repeat. Contact a health care provider if:  Your neck pain or discomfort gets much worse when you do an exercise.  Your neck pain or discomfort does not improve within 2 hours after you exercise. If you have any of these problems, stop exercising right away. Do not do the exercises again unless your health care provider says that you can. Get help right away if:  You develop sudden, severe neck pain. If this happens, stop exercising right away. Do not do the exercises again unless your health care provider says that you can. Exercises Neck Stretch  Repeat this exercise 3-5 times. 1. Do this exercise while standing or while sitting in a chair. 2. Place your feet flat  on the floor, shoulder-width apart. 3. Slowly turn your head to the right. Turn it all the way to the right so you can look over your right shoulder. Do not tilt or tip your head. 4. Hold this position for 10-30 seconds. 5. Slowly turn your head to the left, to look over your left shoulder. 6. Hold this position for 10-30 seconds. Neck Retraction Repeat this exercise 8-10 times. Do this 3-4 times a day or as told by your health care provider. 1. Do this exercise while standing or while sitting in a sturdy chair. 2. Look straight ahead. Do not bend your neck. 3. Use your fingers to push your chin backward. Do not bend your neck for this movement. Continue to face straight ahead. If you are doing the exercise properly, you will feel a slight sensation in your throat and a stretch at the back of your neck. 4. Hold the stretch for 1-2 seconds. Relax and repeat. This information is not intended to replace advice given to you by your health care provider. Make sure you discuss any questions you have with your health care provider. Document Released: 11/12/2015 Document Revised:  05/08/2016 Document Reviewed: 06/11/2015 Elsevier Interactive Patient Education  2017 [employer].

## 2017-04-08 ENCOUNTER — Telehealth: Payer: Self-pay

## 2017-04-08 NOTE — Telephone Encounter (Signed)
Left vm for patient that a new rx of topamax was sent to her pharmacy. Rn left message that her insurance will not cover the trokendi now. RN stated she has to try the generic form now. VM left stated medication is at [facility] now. Rn stated its one pill twice a day. Rn left [facility] number to call back with any concerns.

## 2017-04-08 NOTE — Telephone Encounter (Signed)
Rn call [facility] at [phone]. Rn stated the trokendi is requiring a PA. Rn stated with the pt insurance the trokendi will not get approve. Rn stated to cancel the trokendi rx.,and Dr. Pearlean Brownie will prescribed the topamax. generic form.Pt has to failed topamax first before trokendi can be prescribed if her insurance will approve it.

## 2017-04-13 ENCOUNTER — Ambulatory Visit (INDEPENDENT_AMBULATORY_CARE_PROVIDER_SITE_OTHER): Payer: Medicare Other

## 2017-04-13 DIAGNOSIS — G43009 Migraine without aura, not intractable, without status migrainosus: Secondary | ICD-10-CM

## 2017-04-13 DIAGNOSIS — R55 Syncope and collapse: Secondary | ICD-10-CM | POA: Diagnosis not present

## 2017-04-28 ENCOUNTER — Telehealth: Payer: Self-pay

## 2017-04-28 NOTE — Telephone Encounter (Signed)
-----   Message from Pramod S Sethi, MD sent at 04/27/2017  6:42 PM EDT ----- Kindly inform the patient that EEG study was normal 

## 2017-04-28 NOTE — Telephone Encounter (Signed)
Left vm for patient to call back during business hours for EEG results. 

## 2017-04-29 NOTE — Telephone Encounter (Signed)
Rn call patient that her EEG was normal. PT verbalized understanding. 

## 2017-04-29 NOTE — Telephone Encounter (Signed)
Patient called office returning RN's call.  Please call °

## 2017-07-13 ENCOUNTER — Telehealth: Payer: Self-pay

## 2017-07-13 ENCOUNTER — Ambulatory Visit: Payer: Medicare Other | Admitting: Neurology

## 2017-07-13 NOTE — Telephone Encounter (Signed)
PATIENT WAS NO SHOW FOR APPT TODAY. 

## 2017-07-14 ENCOUNTER — Encounter: Payer: Self-pay | Admitting: Neurology

## 2017-10-20 ENCOUNTER — Encounter (HOSPITAL_COMMUNITY): Payer: Self-pay

## 2018-01-12 ENCOUNTER — Ambulatory Visit (HOSPITAL_COMMUNITY)
Admission: EM | Admit: 2018-01-12 | Discharge: 2018-01-12 | Disposition: A | Discharge status: Home / Self Care | Payer: Medicare Other | Attending: Family Medicine | Admitting: Family Medicine

## 2018-01-12 ENCOUNTER — Ambulatory Visit (INDEPENDENT_AMBULATORY_CARE_PROVIDER_SITE_OTHER): Payer: Medicare Other

## 2018-01-12 ENCOUNTER — Encounter (HOSPITAL_COMMUNITY): Payer: Self-pay | Admitting: Emergency Medicine

## 2018-01-12 DIAGNOSIS — M25562 Pain in left knee: Secondary | ICD-10-CM

## 2018-01-12 DIAGNOSIS — S80219A Abrasion, unspecified knee, initial encounter: Secondary | ICD-10-CM

## 2018-01-12 DIAGNOSIS — W19XXXA Unspecified fall, initial encounter: Secondary | ICD-10-CM

## 2018-01-12 DIAGNOSIS — T148XXA Other injury of unspecified body region, initial encounter: Secondary | ICD-10-CM

## 2018-01-12 NOTE — ED Provider Notes (Signed)
[facility]    CSN: [id number] Arrival date & time: 01/12/18  1109     History   Chief Complaint Chief Complaint  Patient presents with  . Fall    HPI Leah Baird is a 62 y.o. female.   With history of depression and hypertension, slipped and fell on a parking lot this morning and landed on her left knee. Reports left knee pain 8/10. Reports pain on ambulation. Able to bear some weight. Denies any head injury. Denies abdominal pain, headache or dizziness.       Past Medical History:  Diagnosis Date  . Depression   . Hypertension   . Mental disorder     Patient Active Problem List   Diagnosis Date Noted  . Right shoulder pain 12/17/2016  . APHTHOUS ULCERS 07/06/2007  . DYSPEPSIA, CHRONIC 07/06/2007  . RECTAL BLEEDING 07/06/2007  . ABSCESS, INNER THIGH 07/06/2007    Past Surgical History:  Procedure Laterality Date  . ABDOMINAL HYSTERECTOMY     partial  . cyst removed from spine    . TUBAL LIGATION Bilateral     OB History    Gravida Para Term Preterm AB Living   2         2   SAB TAB Ectopic Multiple Live Births                   Home Medications    Prior to Admission medications   Medication Sig Start Date End Date Taking? Authorizing Provider  baclofen (LIORESAL) 10 MG tablet Take by mouth.   Yes [provider]  BIOTIN PO Take by mouth.   Yes [provider]  gabapentin (NEURONTIN) 300 MG capsule Take by mouth. 05/09/15  Yes [provider]  hydrochlorothiazide (HYDRODIURIL) 25 MG tablet Take 25 mg by mouth daily.   Yes [provider]  ipratropium (ATROVENT) 0.06 % nasal spray Place 2 sprays into both nostrils 4 (four) times daily. 01/28/14  Yes Presson, Jess Barters H, PA  meloxicam (MOBIC) 15 MG tablet Take 1 tablet (15 mg total) by mouth daily. 02/01/13  Yes Harris, Cammy Copa, PA-C  Multiple Vitamins-Minerals (MULTIPLE VITAMINS/WOMENS) tablet Take 1 tablet by mouth daily.   Yes [provider]  naproxen sodium (ANAPROX) 220 MG tablet Take 220 mg by mouth 2 (two) times daily with a meal.   Yes [provider]  simvastatin (ZOCOR) 10 MG tablet Take 10 mg by mouth.   Yes [provider]  topiramate (TOPAMAX) 50 MG tablet Take 1 tablet (50 mg total) by mouth 2 (two) times daily. 04/07/17  Yes Micki Riley, MD  VITAMIN A PO Take by mouth.   Yes [provider]  vitamin B-12 (CYANOCOBALAMIN) 100 MCG tablet Take by mouth.   Yes [provider]  VITAMIN D, CHOLECALCIFEROL, PO Take by mouth.   Yes [provider]  VITAMIN E PO Take by mouth.   Yes [provider]  benzonatate (TESSALON) 100 MG capsule Take 1 capsule (100 mg total) by mouth 3 (three) times daily as needed for cough. 01/28/14   Presson, Mathis Fare, PA  HYDROcodone-acetaminophen (NORCO/VICODIN) 5-325 MG tablet Take 2 tablets by mouth every 4 (four) hours as needed. 07/11/16   Long, Arlyss Repress, MD  ibuprofen (ADVIL,MOTRIN) 200 MG tablet Take 400 mg by mouth every 6 (six) hours as needed. For pain     [provider]  loratadine (CLARITIN) 10 MG tablet Take 1 tablet (10 mg  total) by mouth daily. 01/28/14   Presson, Mathis Fare, PA  magic mouthwash SOLN Take 5 mLs by mouth 3 (three) times daily as needed for mouth pain. 07/11/16   Long, Arlyss Repress, MD  Multiple Vitamins-Minerals (VITACEL) TABS Take 1 tablet by mouth daily.    [provider]    Family History Family History  Problem Relation Age of Onset  . Breast cancer Mother   . Breast cancer Sister     Social History Social History   Tobacco Use  . Smoking status: Former Smoker    Packs/day: 0.10    Years: 10.00    Pack years: 1.00    Types: Cigarettes  . Smokeless tobacco: Never Used  Substance Use Topics  . Alcohol use: No  . Drug use: No     Allergies   Sulfonamide derivatives and Tramadol hcl   Review of Systems Review of Systems  Constitutional:       See HPI      Physical Exam Triage Vital Signs ED Triage Vitals [01/12/18 1147]  Enc Vitals Group     BP (!) 144/88     Pulse Rate 64     Resp 18     Temp 98.8 F (37.1 C)     Temp Source Oral     SpO2 96 %     Weight 250 lb (113.4 kg)     Height      Head Circumference      Peak Flow      Pain Score 8     Pain Loc      Pain Edu?      Excl. in GC?    No data found.  Updated Vital Signs BP (!) 144/88 (BP Location: Left Arm) Comment: Notified [place]  Pulse 64   Temp 98.8 F (37.1 C) (Oral)   Resp 18   Wt 250 lb (113.4 kg)   SpO2 96%   BMI 38.58 kg/m   Physical Exam  Constitutional: She is oriented to person, place, and time. She appears well-developed and well-nourished.  Cardiovascular: Normal rate.  Pulmonary/Chest: Effort normal.  Musculoskeletal:  Left knee has limited ROM due to pain. Tender to palpate. No swelling noted. No deformity noted.   Neurological: She is alert and oriented to person, place, and time.  Skin:  Left knee abrasion noted  Nursing note and vitals reviewed.   UC Treatments / Results  Labs (all labs ordered are listed, but only abnormal results are displayed) Labs Reviewed - No data to display  EKG  EKG Interpretation None       Radiology Dg Knee Complete 4 Views Left  Result Date: 01/12/2018 CLINICAL DATA:  Recent fall today with knee pain, initial encounter EXAM: LEFT KNEE - COMPLETE 4+ VIEW COMPARISON:  None. FINDINGS: Mild osteophytic changes are noted in all 3 joint compartments. Minimal joint effusion is seen. No acute fracture or dislocation is noted. No soft tissue abnormality is seen. IMPRESSION: Mild degenerative change without acute abnormality. Electronically Signed   By: Alcide Clever M.D.   On: 01/12/2018 12:23    Procedures Procedures (including critical care time)  Medications Ordered in UC Medications - No data to display   Initial Impression / Assessment and Plan / UC Course  I have reviewed the triage vital  signs and the nursing notes.  Pertinent labs & imaging results that were available during my care of the patient were reviewed by me and considered in my medical decision making (  see chart for details).  Final Clinical Impressions(s) / UC Diagnoses   Final diagnoses:  Fall, initial encounter  Skin abrasion  Acute pain of left knee   Xray was negative for fracture; instead it shows mild degenerative changes. Patient informed of the xray result.   Wound cleaned today; dressing applied; continue OTC antibiotic ointment at home.   Take ibuprofen PRN for pain relief.   F/u with PCP for worsening symptoms.   ED Discharge Orders    None     Controlled Substance Prescriptions [place] Controlled Substance Registry consulted? Yes   Lucia EstelleZheng, Santana Gosdin, NP 01/12/18 1233

## 2018-01-12 NOTE — ED Triage Notes (Signed)
PT reports she fell and landed on left knee. This happened earlier today.

## 2018-04-20 IMAGING — DX DG KNEE COMPLETE 4+V*L*
4 series · 4 of 4 positions shown · non-contrast
Comparison: None.

CLINICAL DATA: Recent fall today with knee pain, initial encounter

EXAM:
LEFT KNEE - COMPLETE 4+ VIEW

[knee ap]
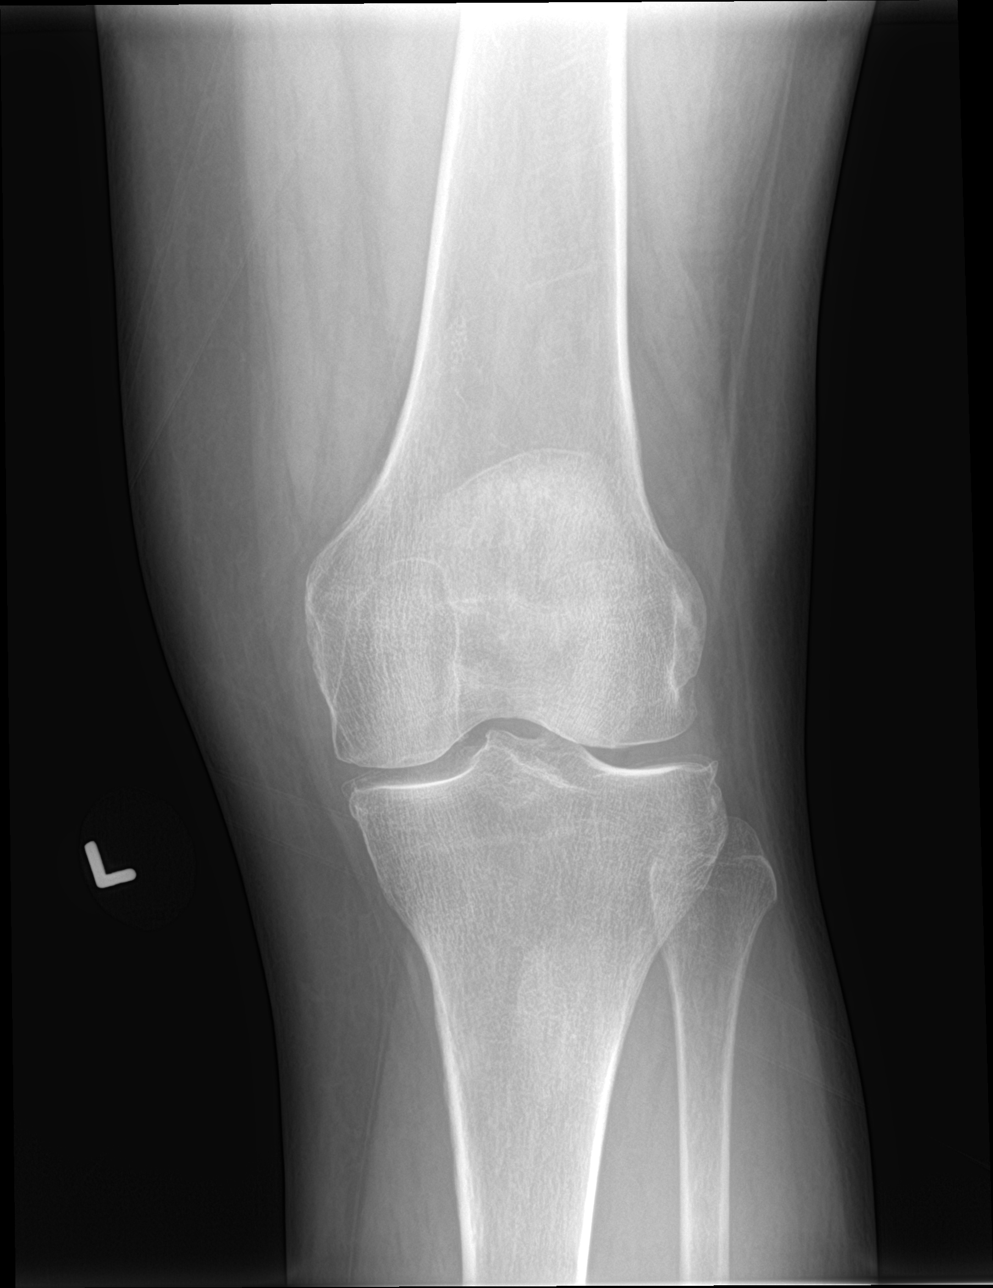

[knee obl (1 of 2)]
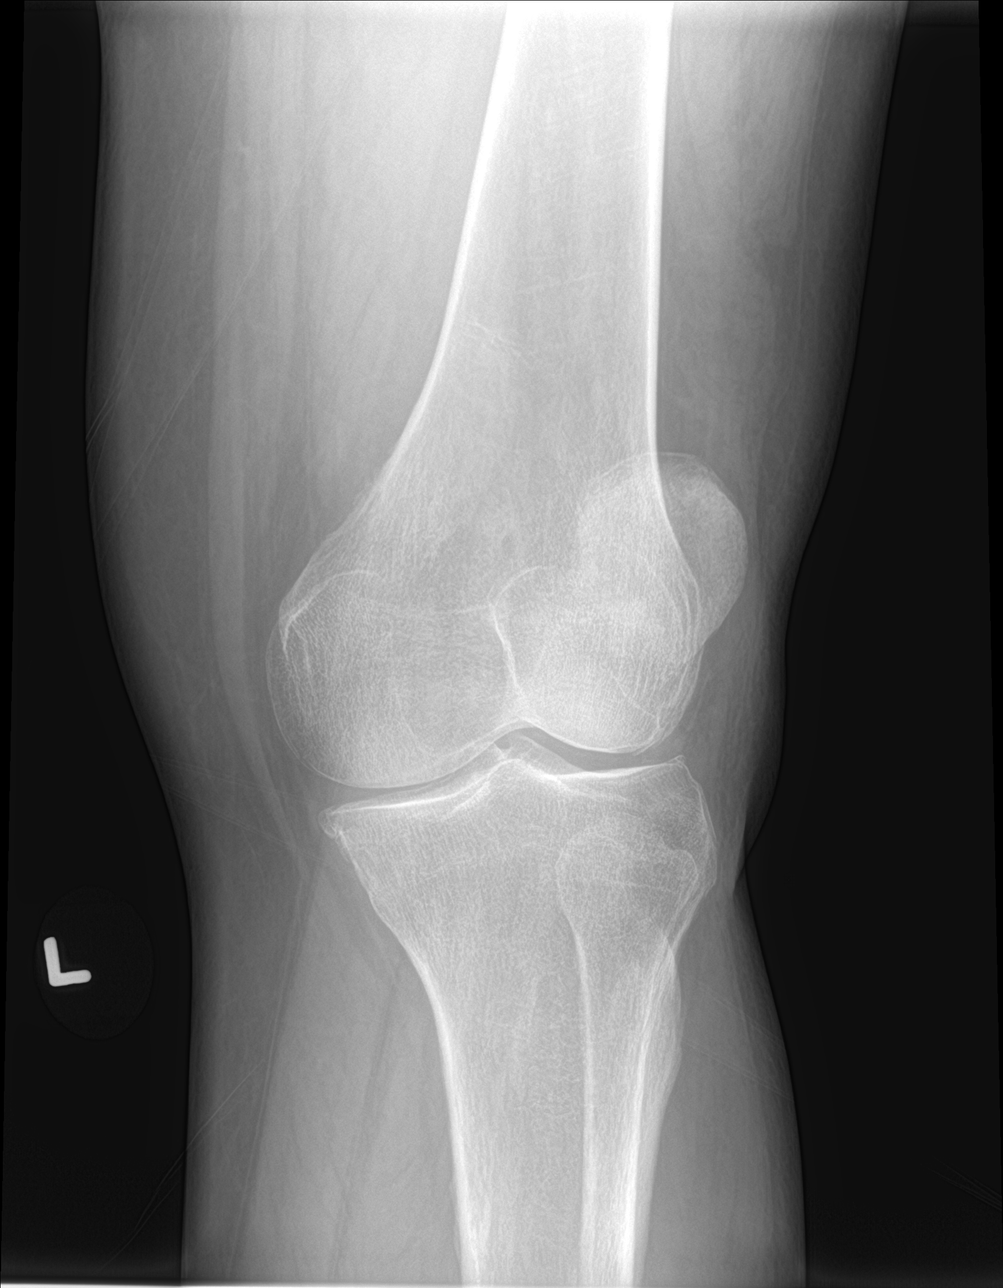

[knee obl (2 of 2)]
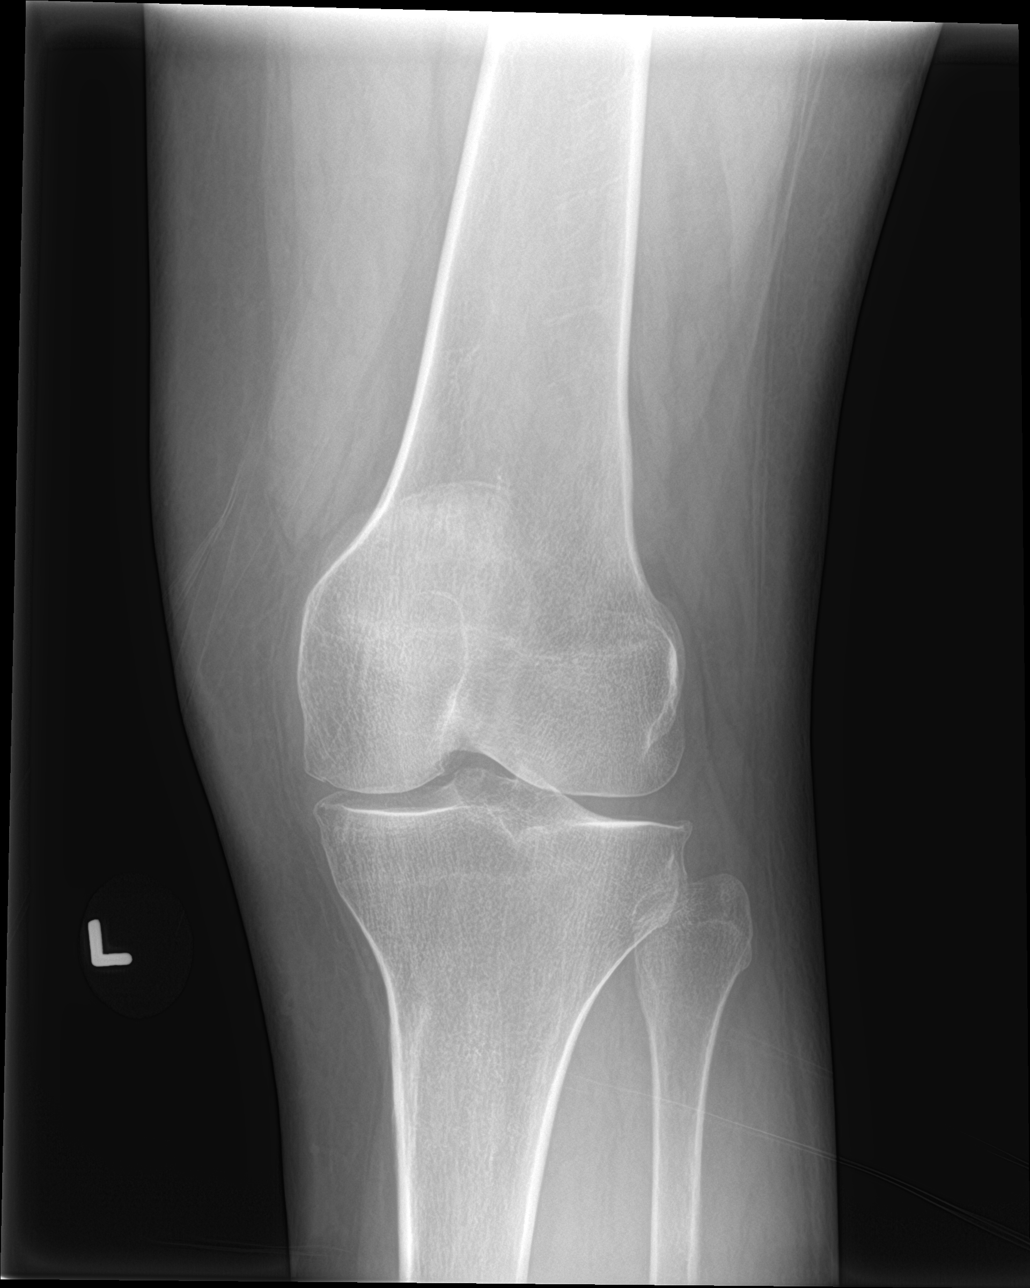

[knee lat]
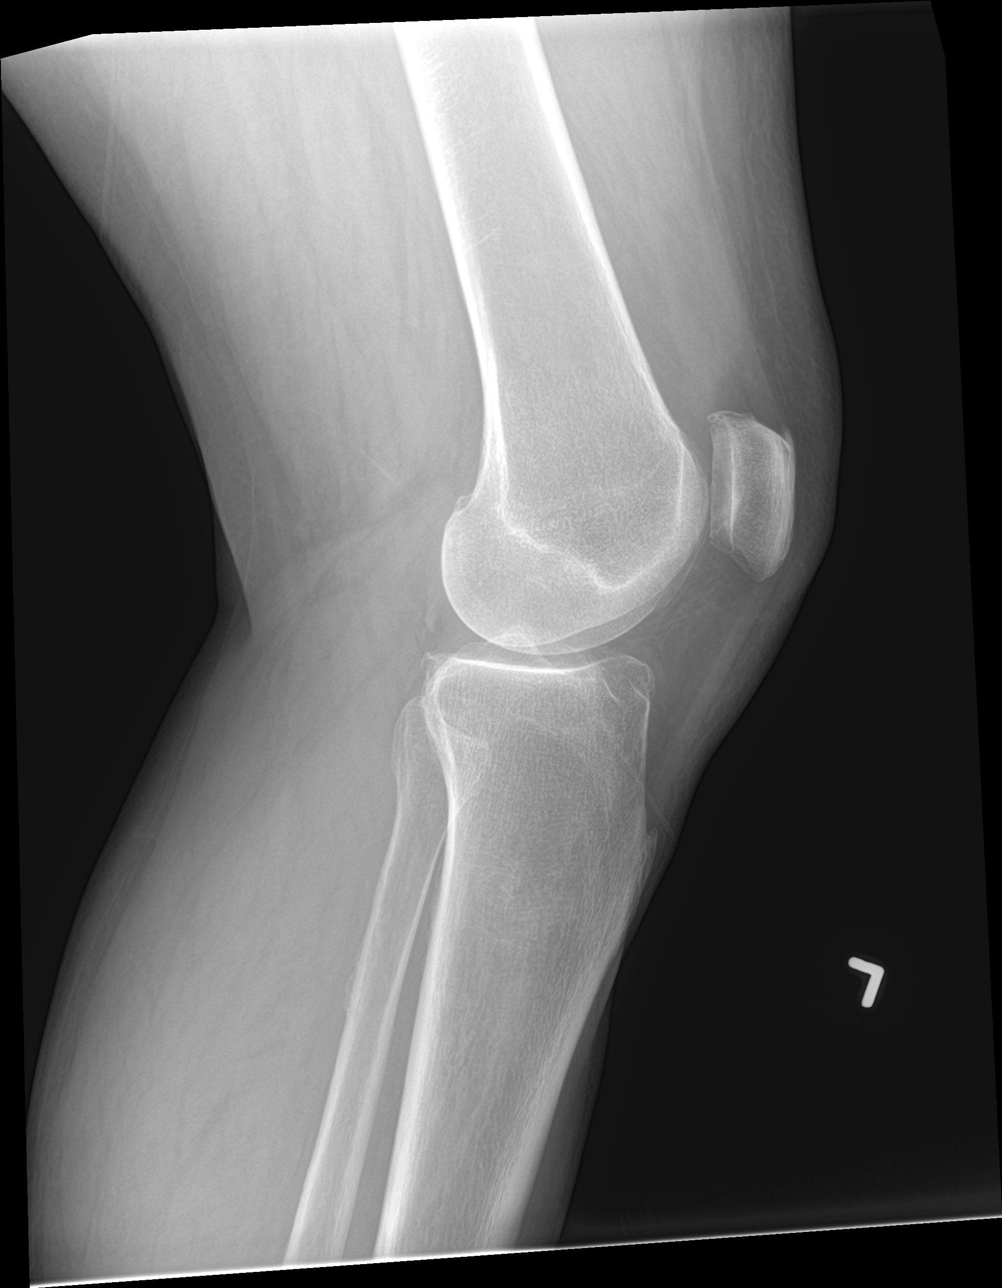

[4 of 4 positions shown; findings below may reference images not displayed]

FINDINGS: Mild osteophytic changes are noted in all 3 joint compartments.
Minimal joint effusion is seen. No acute fracture or dislocation is
noted. No soft tissue abnormality is seen.
IMPRESSION: Mild degenerative change without acute abnormality.

## 2018-10-07 ENCOUNTER — Other Ambulatory Visit: Payer: Self-pay | Admitting: Internal Medicine

## 2018-10-07 DIAGNOSIS — M5020 Other cervical disc displacement, unspecified cervical region: Secondary | ICD-10-CM

## 2018-10-13 ENCOUNTER — Other Ambulatory Visit: Payer: Self-pay | Admitting: Chiropractor

## 2018-10-13 DIAGNOSIS — M5020 Other cervical disc displacement, unspecified cervical region: Secondary | ICD-10-CM

## 2018-10-15 ENCOUNTER — Ambulatory Visit
Admission: RE | Admit: 2018-10-15 | Discharge: 2018-10-15 | Disposition: A | Discharge status: Home / Self Care | Payer: PRIVATE HEALTH INSURANCE | Source: Ambulatory Visit | Attending: Internal Medicine | Admitting: Internal Medicine

## 2018-10-15 DIAGNOSIS — M5020 Other cervical disc displacement, unspecified cervical region: Secondary | ICD-10-CM

## 2018-10-16 ENCOUNTER — Other Ambulatory Visit: Payer: Medicare Other

## 2019-01-11 ENCOUNTER — Other Ambulatory Visit: Payer: Self-pay | Admitting: Family Medicine

## 2019-01-13 DIAGNOSIS — Z903 Acquired absence of stomach [part of]: Secondary | ICD-10-CM | POA: Insufficient documentation

## 2019-01-22 DIAGNOSIS — M722 Plantar fascial fibromatosis: Secondary | ICD-10-CM | POA: Insufficient documentation

## 2019-01-22 DIAGNOSIS — M24571 Contracture, right ankle: Secondary | ICD-10-CM | POA: Insufficient documentation

## 2019-02-14 DIAGNOSIS — G8929 Other chronic pain: Secondary | ICD-10-CM | POA: Insufficient documentation

## 2019-03-07 ENCOUNTER — Telehealth (INDEPENDENT_AMBULATORY_CARE_PROVIDER_SITE_OTHER): Payer: Self-pay

## 2019-03-07 NOTE — Telephone Encounter (Signed)
  Called patient. No answer. Not a working number.  If they return call, please ask them screening questions below. Thank you.   Do you have now or have you had in the past 7 days a fever and/or chills?   Do you have now or have you had in the past 7 days a cough?   Do you have now or have you had in the last 7 days nausea, vomiting or abdominal pain?   Have you been exposed to anyone who has tested positive for COVID-19?   Have you or anyone who lives with you traveled within the last month?

## 2019-03-08 ENCOUNTER — Ambulatory Visit (INDEPENDENT_AMBULATORY_CARE_PROVIDER_SITE_OTHER): Payer: Medicare Other | Admitting: Orthopaedic Surgery

## 2019-03-22 ENCOUNTER — Telehealth (INDEPENDENT_AMBULATORY_CARE_PROVIDER_SITE_OTHER): Payer: Self-pay | Admitting: Radiology

## 2019-03-22 NOTE — Telephone Encounter (Signed)
I attempted to reach patient to confirm appointment for 03/23/2019 and ask COVID-19 screening questions. Phone number is not a working number.

## 2019-03-23 ENCOUNTER — Ambulatory Visit (INDEPENDENT_AMBULATORY_CARE_PROVIDER_SITE_OTHER): Payer: Medicare Other | Admitting: Orthopaedic Surgery

## 2019-03-28 ENCOUNTER — Telehealth (INDEPENDENT_AMBULATORY_CARE_PROVIDER_SITE_OTHER): Payer: Self-pay | Admitting: Radiology

## 2019-03-28 NOTE — Telephone Encounter (Signed)
I attempted to reach patient to reschedule appointment for 4/14 afternoon. Number listed is not a working number.  I called Emergency Contact number for son to have patient return call. No answer/no voicemail picked up.

## 2019-03-29 ENCOUNTER — Ambulatory Visit (INDEPENDENT_AMBULATORY_CARE_PROVIDER_SITE_OTHER): Payer: Medicare Other | Admitting: Orthopaedic Surgery

## 2019-04-12 ENCOUNTER — Other Ambulatory Visit: Payer: Self-pay

## 2019-04-12 ENCOUNTER — Ambulatory Visit (INDEPENDENT_AMBULATORY_CARE_PROVIDER_SITE_OTHER): Payer: Medicare Other | Admitting: Orthopaedic Surgery

## 2019-04-12 ENCOUNTER — Encounter (INDEPENDENT_AMBULATORY_CARE_PROVIDER_SITE_OTHER): Payer: Self-pay | Admitting: Orthopaedic Surgery

## 2019-04-12 VITALS — Ht 67.0 in | Wt 242.5 lb

## 2019-04-12 DIAGNOSIS — M5126 Other intervertebral disc displacement, lumbar region: Secondary | ICD-10-CM | POA: Insufficient documentation

## 2019-04-12 DIAGNOSIS — M47812 Spondylosis without myelopathy or radiculopathy, cervical region: Secondary | ICD-10-CM | POA: Diagnosis not present

## 2019-04-12 NOTE — Progress Notes (Signed)
Office Visit Note   Patient: Leah HighlandRachel J Okeefe           Date of Birth: October 18, 1956           MRN: [id number] Visit Date: 04/12/2019              Requested by: Elizabeth PalauAnderson, Teresa, FNP [address] [place] [id number] PCP: Elizabeth PalauAnderson, Teresa, FNP   Assessment & Plan: Visit Diagnoses:  1. Protrusion of lumbar intervertebral disc   2. Spondylosis without myelopathy or radiculopathy, cervical region     Plan: Patient does have some mild stenosis at C4-5 centrally with severe left foraminal stenosis.  We will set her up for some physical therapy for treatment of her lumbar spine where she has mild stenosis at L3-4 centrally.  We can recheck her again in 6 weeks to check her progress.  She requested returning to [facility] where she is had satisfactory treatment in the past.  Follow-Up Instructions: Return in about 6 weeks (around 05/24/2019).   Orders:  Orders Placed This Encounter  Procedures   Ambulatory referral to Physical Therapy   No orders of the defined types were placed in this encounter.     Procedures: No procedures performed   Clinical Data: No additional findings.   Subjective: Chief Complaint  Patient presents with   Lower Back - Pain    HPI 63 year old female seen with severe back and right buttocks pain which is been present for more than 2 months.  She been seen in the emergency room at [facility] has been on gabapentin baclofen, diclofenac also ibuprofen without relief.  Patient was placed on prednisone about 3 weeks ago.  Patient states she feels somewhat unbalanced.  She states she has had numbness in her right and left fourth and fifth fingers.  [facility] which show degenerative changes lumbar spine without acute osseous process with mild stenosis at L 3-4 level.  No neural foraminal stenosis at L3-4.Plain radiograph series 02/14/2019 showed mild lower lumbar  spondylosis without evidence of acute or traumatic subluxation read by Carey BullocksWilliam Veazey, MD.  Podiatry x-rays of her foot showed some inferior calcaneal spurring consistent with plantar fascia problems.  Patient had a right shoulder injection by me 12/17/2016 with improvement in her shoulder pain.  At that time she noted she had some memory loss.  Previous cervical MRI 10/15/2018 showed mild stenosis at C4-5 and severe left C5 foraminal stenosis.  Mild C3-4 central stenosis without cord signal abnormality.  Patient adds that she has had to use depends for the last 2 months with problems with bladder control.  History of 2 pregnancies 1 child is grown.  Review of Systems positive for history of shoulder impingement, rectal bleeding, abscess in her thigh 2008.  Positive for depression hypertension mental disorder.  Previous tubal ligation abdominal hysterectomy.   Objective: Vital Signs: Ht 5\' 7"  (1.702 m)    Wt 242 lb 8.1 oz (110 kg)    BMI 37.98 kg/m   Physical Exam Constitutional:      Appearance: She is well-developed.  HENT:     Head: Normocephalic.     Right Ear: External ear normal.     Left Ear: External ear normal.  Eyes:     Pupils: Pupils are equal, round, and reactive to light.  Neck:     Thyroid: No thyromegaly.     Trachea: No tracheal deviation.  Cardiovascular:  Rate and Rhythm: Normal rate.  Pulmonary:     Effort: Pulmonary effort is normal.  Abdominal:     Palpations: Abdomen is soft.  Skin:    General: Skin is warm and dry.  Neurological:     Mental Status: She is alert and oriented to person, place, and time.  Psychiatric:        Behavior: Behavior normal.     Ortho Exam patient has good cervical range of motion mild brachial plexus tenderness negative Spurling upper extremity reflexes are 2+ and symmetrical negative impingement of her shoulders.  She is able to heel and toe walk.  She has tenderness over the right sciatic notch negative straight leg raising 90  degrees negative logroll to her hips.  Mild tenderness over the plantar fascial origin without limping.  Knee and ankle jerk are intact.  Anterior tib gastrocsoleus is strong.  No lower extremity clonus.  Negative Lhermitte. Specialty Comments:  No specialty comments available.  Imaging: No results found.   PMFS History: Patient Active Problem List   Diagnosis Date Noted   Protrusion of lumbar intervertebral disc 04/12/2019   Right shoulder pain 12/17/2016   APHTHOUS ULCERS 07/06/2007   DYSPEPSIA, CHRONIC 07/06/2007   RECTAL BLEEDING 07/06/2007   ABSCESS, INNER THIGH 07/06/2007   Past Medical History:  Diagnosis Date   Depression    Hypertension    Mental disorder     Family History  Problem Relation Age of Onset   Breast cancer Mother    Breast cancer Sister     Past Surgical History:  Procedure Laterality Date   ABDOMINAL HYSTERECTOMY     partial   cyst removed from spine     TUBAL LIGATION Bilateral    Social History   Occupational History   Not on file  Tobacco Use   Smoking status: Former Smoker    Packs/day: 0.10    Years: 10.00    Pack years: 1.00    Types: Cigarettes   Smokeless tobacco: Never Used  Substance and Sexual Activity   Alcohol use: No   Drug use: No   Sexual activity: Never

## 2019-04-13 ENCOUNTER — Encounter (INDEPENDENT_AMBULATORY_CARE_PROVIDER_SITE_OTHER): Payer: Self-pay | Admitting: Orthopaedic Surgery

## 2019-04-13 DIAGNOSIS — M47812 Spondylosis without myelopathy or radiculopathy, cervical region: Secondary | ICD-10-CM | POA: Insufficient documentation

## 2019-05-11 ENCOUNTER — Ambulatory Visit (INDEPENDENT_AMBULATORY_CARE_PROVIDER_SITE_OTHER): Payer: Medicare Other | Admitting: Orthopaedic Surgery

## 2019-05-11 ENCOUNTER — Encounter: Payer: Self-pay | Admitting: Orthopaedic Surgery

## 2019-05-11 ENCOUNTER — Other Ambulatory Visit: Payer: Self-pay

## 2019-05-11 DIAGNOSIS — G8929 Other chronic pain: Secondary | ICD-10-CM

## 2019-05-11 DIAGNOSIS — M533 Sacrococcygeal disorders, not elsewhere classified: Secondary | ICD-10-CM | POA: Diagnosis not present

## 2019-05-11 DIAGNOSIS — M47816 Spondylosis without myelopathy or radiculopathy, lumbar region: Secondary | ICD-10-CM

## 2019-05-11 NOTE — Progress Notes (Signed)
Office Visit Note   Patient: Leah Baird           Date of Birth: [birth year]           MRN: [id number] Visit Date: 05/11/2019              Requested by: Elizabeth PalauAnderson, Teresa, FNP [address] [place], [place] [id number] PCP: Elizabeth PalauAnderson, Teresa, FNP   Assessment & Plan: Visit Diagnoses:  1. Spondylosis without myelopathy or radiculopathy, lumbar region   2. Chronic SI joint pain     Plan: Since patient is not really doing better after this point after starting physical therapy recommend that she see Dr. Alvester MorinNewton for consult to discuss possible injection therapy.  We will see what he thinks about lumbar ESI's versus possible SI joint injections.  Follow-up with Dr. Ophelia CharterYates in a few weeks for recheck.  She also requested to go to another physical therapy office and new prescription was given.  Follow-Up Instructions: Return in about 4 weeks (around 06/08/2019) for dr yates.   Orders:  Orders Placed This Encounter  Procedures  . Ambulatory referral to Physical Medicine Rehab  . Ambulatory referral to Physical Therapy   No orders of the defined types were placed in this encounter.     Procedures: No procedures performed   Clinical Data: No additional findings.   Subjective: Chief Complaint  Patient presents with  . Lower Back - Follow-up    HPI 63 year old black female returns for recheck of her chronic low back pain.  Last seen by Dr. Ophelia CharterYates April 12, 2019 and he ordered physical therapy at that point.  She has been going with states that this is been aggravating her pain and the physical therapist recommended that she return for recheck.  States that she had an injection in February but is not sure of what kind of injection she had.  Currently taking gabapentin, diclofenac, Aleve, baclofen and states that nothing helps. Review of Systems No current cardiac pulmonary GI GU issues  Objective: Vital Signs: There were no vitals taken for this visit.  Physical  Exam HENT:     Head: Normocephalic and atraumatic.  Eyes:     Extraocular Movements: Extraocular movements intact.     Pupils: Pupils are equal, round, and reactive to light.  Pulmonary:     Effort: No respiratory distress.  Musculoskeletal:     Comments: Patient has bilateral lumbar paraspinal tenderness.  Moderate to marked tenderness over the SI joints.  Negative logroll.  Negative straight leg raise.  Neurovascular intact.  No focal motor deficits.  Neurological:     General: No focal deficit present.     Mental Status: She is alert and oriented to person, place, and time.  Psychiatric:        Mood and Affect: Mood normal.        Behavior: Behavior normal.     Ortho Exam  Specialty Comments:  No specialty comments available.  Imaging: No results found.   PMFS History: Patient Active Problem List   Diagnosis Date Noted  . Spondylosis without myelopathy or radiculopathy, cervical region 04/13/2019  . Protrusion of lumbar intervertebral disc 04/12/2019  . Right shoulder pain 12/17/2016  . APHTHOUS ULCERS 07/06/2007  . DYSPEPSIA, CHRONIC 07/06/2007  . RECTAL BLEEDING 07/06/2007  . ABSCESS, INNER THIGH 07/06/2007   Past Medical History:  Diagnosis Date  . Depression   . Hypertension   . Mental disorder     Family History  Problem  Relation Age of Onset  . Breast cancer Mother   . Breast cancer Sister     Past Surgical History:  Procedure Laterality Date  . ABDOMINAL HYSTERECTOMY     partial  . cyst removed from spine    . TUBAL LIGATION Bilateral    Social History   Occupational History  . Not on file  Tobacco Use  . Smoking status: Former Smoker    Packs/day: 0.10    Years: 10.00    Pack years: 1.00    Types: Cigarettes  . Smokeless tobacco: Never Used  Substance and Sexual Activity  . Alcohol use: No  . Drug use: No  . Sexual activity: Never

## 2019-05-14 ENCOUNTER — Telehealth: Payer: Self-pay

## 2019-05-14 NOTE — Telephone Encounter (Signed)
Call went directly to vm, vm not set up. No additional phone number listed. Unable to assess for covid symptoms following potential covid exposure.

## 2019-05-19 ENCOUNTER — Telehealth: Payer: Self-pay | Admitting: Physical Therapy

## 2019-05-19 NOTE — Telephone Encounter (Signed)
Attempted to reach this patient due to potential COVID 19 exposure and that we would need to cancel her appointment with [place] until negative test or 14 day period. She did not answer and voicemail is not set up to leave a message. There is no other number listed. We will have to cancel her appt at this time  Ivery Quale, PT, DPT 05/19/19 2:22 PM

## 2019-05-20 ENCOUNTER — Ambulatory Visit: Payer: Medicare Other | Admitting: Physical Therapy

## 2019-05-24 ENCOUNTER — Encounter: Payer: Self-pay | Admitting: Orthopaedic Surgery

## 2019-05-24 ENCOUNTER — Other Ambulatory Visit: Payer: Self-pay

## 2019-05-24 ENCOUNTER — Ambulatory Visit (INDEPENDENT_AMBULATORY_CARE_PROVIDER_SITE_OTHER): Payer: Medicare Other | Admitting: Orthopaedic Surgery

## 2019-05-24 VITALS — Ht 67.0 in | Wt 242.0 lb

## 2019-05-24 DIAGNOSIS — M5126 Other intervertebral disc displacement, lumbar region: Secondary | ICD-10-CM

## 2019-05-24 MED ORDER — ACETAMINOPHEN-CODEINE #3 300-30 MG PO TABS: 1.0000 | ORAL_TABLET | Freq: Three times a day (TID) | ORAL | 0 refills | Status: AC | PRN

## 2019-05-24 NOTE — Progress Notes (Signed)
Office Visit Note   Patient: Leah HighlandRachel J Dumler           Date of Birth: October 07, 1956           MRN: [id number] Visit Date: 05/24/2019              Requested by: Elizabeth PalauAnderson, Teresa, FNP [address] [place], [place] [id number] PCP: Elizabeth PalauAnderson, Teresa, FNP   Assessment & Plan: Visit Diagnoses:  1. Protrusion of lumbar intervertebral disc     Plan: Patient proceed with physical therapy.  I will plan to recheck her again in 4 weeks and if she has not made some progress will obtain a lumbar MRI scan.  Patient can find out where she had the injections and obtain those records.  She expresses to me that she thinks the injection that she had has caused her problem I discussed with her that she has some sciatica likely from some increase in the lumbar disc protrusion.  Follow-Up Instructions: Return in about 4 weeks (around 06/21/2019).   Orders:  No orders of the defined types were placed in this encounter.  Meds ordered this encounter  Medications  . acetaminophen-codeine (TYLENOL #3) 300-30 MG tablet    Sig: Take 1 tablet by mouth every 8 (eight) hours as needed for moderate pain.    Dispense:  20 tablet    Refill:  0      Procedures: No procedures performed   Clinical Data: No additional findings.   Subjective: Chief Complaint  Patient presents with  . Lower Back - Follow-up    HPI 63 year old female returns with persistent problems with back pain right buttocks pain.  She has not been to physical therapy due to potential coronavirus exposure and she is waiting until 14 days after exposure before she reschedules and starts physical therapy.  Patient states she had previous injections by a physiatry us that she was sent to see by a chiropractor she thinks is Dr.Goldenberg.  She states when she had injection done possibly right SI joint but she states that this was in the buttocks region and since that time she has had significant pain in her buttock that radiates down  her leg.  She points directly sciatic notch and states this is where she is having pain.  She has trouble when she sits she does better standing or laying down.  Previous lumbar MRI scan 2016 showed central protrusion at L4-5 with some progressive central lateral recess stenosis moderate in nature.  Mild changes at other levels.  She is not had a scan since 2016.  She denies chills or fever no bowel bladder symptoms.  Increased pain with bending twisting.  Review of Systems 14 point system update unchanged from 04/12/2019 office visit.  Of note is depression hypertension.  Shoulder impingement.   Objective: Vital Signs: Ht 5\' 7"  (1.702 m)   Wt 242 lb (109.8 kg)   BMI 37.90 kg/m   Physical Exam Constitutional:      Appearance: She is well-developed.  HENT:     Head: Normocephalic.     Right Ear: External ear normal.     Left Ear: External ear normal.  Eyes:     Pupils: Pupils are equal, round, and reactive to light.  Neck:     Thyroid: No thyromegaly.     Trachea: No tracheal deviation.  Cardiovascular:     Rate and Rhythm: Normal rate.  Pulmonary:     Effort: Pulmonary effort is normal.  Abdominal:  Palpations: Abdomen is soft.  Skin:    General: Skin is warm and dry.  Neurological:     Mental Status: She is alert and oriented to person, place, and time.  Psychiatric:        Behavior: Behavior normal.     Ortho Exam patient has positive straight leg raising on the right at 70 degrees.  Knee and ankle jerk are intact anterior tib gastrocsoleus is intact she is amatory with antalgic gait due to buttocks pain short stride gait.  She walks with her hips and forward flexed position.  Specialty Comments:  No specialty comments available.  Imaging: No results found.   PMFS History: Patient Active Problem List   Diagnosis Date Noted  . Spondylosis without myelopathy or radiculopathy, cervical region 04/13/2019  . Protrusion of lumbar intervertebral disc 04/12/2019  .  Right shoulder pain 12/17/2016  . APHTHOUS ULCERS 07/06/2007  . DYSPEPSIA, CHRONIC 07/06/2007  . RECTAL BLEEDING 07/06/2007  . ABSCESS, INNER THIGH 07/06/2007   Past Medical History:  Diagnosis Date  . Depression   . Hypertension   . Mental disorder     Family History  Problem Relation Age of Onset  . Breast cancer Mother   . Breast cancer Sister     Past Surgical History:  Procedure Laterality Date  . ABDOMINAL HYSTERECTOMY     partial  . cyst removed from spine    . TUBAL LIGATION Bilateral    Social History   Occupational History  . Not on file  Tobacco Use  . Smoking status: Former Smoker    Packs/day: 0.10    Years: 10.00    Pack years: 1.00    Types: Cigarettes  . Smokeless tobacco: Never Used  Substance and Sexual Activity  . Alcohol use: No  . Drug use: No  . Sexual activity: Never

## 2019-05-26 ENCOUNTER — Ambulatory Visit: Payer: Medicare Other | Attending: Surgery

## 2019-05-26 DIAGNOSIS — G8929 Other chronic pain: Secondary | ICD-10-CM | POA: Insufficient documentation

## 2019-05-26 DIAGNOSIS — M6281 Muscle weakness (generalized): Secondary | ICD-10-CM | POA: Insufficient documentation

## 2019-05-26 DIAGNOSIS — M25552 Pain in left hip: Secondary | ICD-10-CM | POA: Insufficient documentation

## 2019-05-26 DIAGNOSIS — M545 Low back pain: Secondary | ICD-10-CM | POA: Insufficient documentation

## 2019-05-26 DIAGNOSIS — R2689 Other abnormalities of gait and mobility: Secondary | ICD-10-CM | POA: Insufficient documentation

## 2019-05-26 DIAGNOSIS — M25551 Pain in right hip: Secondary | ICD-10-CM | POA: Insufficient documentation

## 2019-05-31 ENCOUNTER — Other Ambulatory Visit: Payer: Self-pay

## 2019-05-31 ENCOUNTER — Encounter: Payer: Self-pay | Admitting: Physical Therapy

## 2019-05-31 ENCOUNTER — Ambulatory Visit: Payer: Medicare Other | Admitting: Physical Therapy

## 2019-05-31 DIAGNOSIS — M25552 Pain in left hip: Secondary | ICD-10-CM

## 2019-05-31 DIAGNOSIS — M6281 Muscle weakness (generalized): Secondary | ICD-10-CM | POA: Diagnosis present

## 2019-05-31 DIAGNOSIS — G8929 Other chronic pain: Secondary | ICD-10-CM

## 2019-05-31 DIAGNOSIS — M545 Low back pain: Secondary | ICD-10-CM | POA: Diagnosis not present

## 2019-05-31 DIAGNOSIS — R2689 Other abnormalities of gait and mobility: Secondary | ICD-10-CM

## 2019-05-31 DIAGNOSIS — M25551 Pain in right hip: Secondary | ICD-10-CM | POA: Diagnosis present
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
## 2019-05-31 NOTE — Patient Instructions (Signed)
Access Code: [email or web address]  URL: [email or web address]  Date: 05/31/2019  Prepared by: Jeral Pinch   Exercises  Supine Posterior Pelvic Tilt - 10 reps - 3 hold - 2x daily  Supine Lower Trunk Rotation - 10 reps - 5 hold - 2x daily  Hooklying Single Knee to Chest - 2-3 reps - 10-15 hold - 2x daily

## 2019-06-03 ENCOUNTER — Ambulatory Visit: Payer: PRIVATE HEALTH INSURANCE | Admitting: Physical Therapy

## 2019-06-13 ENCOUNTER — Other Ambulatory Visit: Payer: Self-pay

## 2019-06-13 ENCOUNTER — Ambulatory Visit: Payer: Medicare Other | Admitting: Physical Therapy

## 2019-06-13 ENCOUNTER — Encounter: Payer: Self-pay | Admitting: Physical Therapy

## 2019-06-13 DIAGNOSIS — M25551 Pain in right hip: Secondary | ICD-10-CM

## 2019-06-13 DIAGNOSIS — M6281 Muscle weakness (generalized): Secondary | ICD-10-CM

## 2019-06-13 DIAGNOSIS — M545 Low back pain: Secondary | ICD-10-CM | POA: Diagnosis not present

## 2019-06-13 DIAGNOSIS — M25552 Pain in left hip: Secondary | ICD-10-CM

## 2019-06-13 DIAGNOSIS — G8929 Other chronic pain: Secondary | ICD-10-CM

## 2019-06-13 DIAGNOSIS — R2689 Other abnormalities of gait and mobility: Secondary | ICD-10-CM

## 2019-06-13 NOTE — Patient Instructions (Signed)
Stretching: Hamstring (Supine)    Supporting right thigh behind knee, slowly straighten knee until stretch is felt in back of thigh. Hold __30__ seconds. Repeat ___3_ times per set. Do ____1 sets per session. Do ____ 2sessions per day.  YOU CAN USE A TOWEL AROUND THE ARCH OF YOUR FOOT IF YOU LIKE   http://orth.exer.us/657   Copyright  VHI. All rights reserved.   Bridge    Lie back, legs bent. Inhale, pressing hips up. Keeping ribs in, lengthen lower back. Exhale, rolling down along spine from top. Repeat _8-10___ times. Do __2__ sessions per day.  http://pm.exer.us/55   Copyright  VHI. All rights reserved.    Piriformis Stretch    Lying on back, pull right knee toward opposite shoulder. Hold __30__ seconds. Can also push knee away as it stays crossed over the opposite knee.  Repeat __3__ times. Do __2__ sessions per day.  http://gt2.exer.us/258   Copyright  VHI. All rights reserved.

## 2019-06-13 NOTE — Therapy (Signed)
[facility] [address] [place], [place], [address] Phone: [phone]   Fax:  [phone]  Physical Therapy Treatment  Patient Details  Name: Leah HighlandRachel J Deavers MRN: [id number] Date of Birth: [birth year] Referring Provider (PT):  Alvin CritchleyJames Ovwens PA-C   Encounter Date: 06/13/2019  PT End of Session - 06/13/19 1241    Visit Number  2    Number of Visits  18    Date for PT Re-Evaluation  07/12/19    Authorization Type  UHC MRC/MCD    Authorization Time Period  pnote 10th visit    PT Start Time  1235    PT Stop Time  1325    PT Time Calculation (min)  50 min    Activity Tolerance  Patient limited by pain    Behavior During Therapy  Anxious       Past Medical History:  Diagnosis Date  . Depression   . Hypertension   . Mental disorder     Past Surgical History:  Procedure Laterality Date  . ABDOMINAL HYSTERECTOMY     partial  . cyst removed from spine    . TUBAL LIGATION Bilateral     There were no vitals filed for this visit.  Subjective Assessment - 06/13/19 1238    Subjective  Patient in a lot of pain today. Has been doing her exercises a bit.  I got a knot in here (point to Rt LS spine)    Currently in Pain?  Yes    Pain Score  9     Pain Location  Back    Pain Orientation  Right;Lower    Pain Descriptors / Indicators  Aching    Pain Type  Chronic pain    Pain Onset  More than a month ago    Pain Frequency  Constant    Pain Relieving Factors  meds    Effect of Pain on Daily Activities  limited mobility        [facility] Adult PT Treatment/Exercise - 06/13/19 0001      Lumbar Exercises: Stretches   Active Hamstring Stretch  2 reps;30 seconds    Active Hamstring Stretch Limitations  strap     Single Knee to Chest Stretch  Left;Right;2 reps;10 seconds    Lower Trunk Rotation  5 reps;20 seconds;30 seconds    Pelvic Tilt  10 reps    ITB Stretch  2 reps;30 seconds    Piriformis Stretch  2 reps;30 seconds      Lumbar Exercises: Aerobic   Nustep  5 min L5 UE and LE       Cryotherapy   Number Minutes Cryotherapy  10 Minutes    Cryotherapy Location  Hip    Type of Cryotherapy  Ice pack      Manual Therapy   Manual therapy comments  RLE LAD x 5 gentle              PT Education - 06/13/19 1319    Education Details  HEP, RICE    Person(s) Educated  Patient    Methods  Explanation    Comprehension  Verbalized understanding;Returned demonstration          PT Long Term Goals - 06/13/19 1254      PT LONG TERM GOAL #1   Title  I with HEP and return to community exercise class ( 07/12/2019)    Status  On-going      PT LONG TERM GOAL #2   Title  She will  report 50% decrease in pain in back and hips with bed mobility and transfers ( 07/12/2019)    Status  On-going      PT LONG TERM GOAL #3   Title  demo bilat hip strength =/> 4+/5 to support ambulation without an assistive device ( 07/12/2019)    Status  On-going      PT LONG TERM GOAL #4   Title  tolerate core work with no more than 3/10 pain in her back ( 07/12/2019)    Status  On-going      PT LONG TERM GOAL #5   Title  improve FOTO =/< 52% limited ( 07/12/2019)    Status  On-going            Plan - 06/13/19 1247    Clinical Impression Statement  Patient with severe pain in Rt buttock today.  Able to do exercises but had trouble walking normally over to the bike afterwards.  She did have less pain with the use of ice post session.    PT Treatment/Interventions  Patient/family education;Therapeutic activities;Moist Heat;Ultrasound;Therapeutic exercise;Passive range of motion;Spinal Manipulations;Dry needling;Joint Manipulations;Electrical Stimulation;Neuromuscular re-education;Manual techniques    PT Next Visit Plan  progress core work, modalities PRN, manual work to low back/gluts PRN    PT Home Exercise Plan  lower trunk rotation, knee to chest, posterior pelvic tilt, bridge, hamstring and piriformis    Consulted and Agree  with Plan of Care  Patient       Patient will benefit from skilled therapeutic intervention in order to improve the following deficits and impairments:  Abnormal gait, Decreased range of motion, Difficulty walking, Obesity, Increased muscle spasms, Pain, Improper body mechanics, Decreased strength  Visit Diagnosis: 1. Chronic bilateral low back pain without sciatica   2. Pain in left hip   3. Pain in right hip   4. Muscle weakness (generalized)   5. Other abnormalities of gait and mobility        Problem List Patient Active Problem List   Diagnosis Date Noted  . Spondylosis without myelopathy or radiculopathy, cervical region 04/13/2019  . Protrusion of lumbar intervertebral disc 04/12/2019  . Right shoulder pain 12/17/2016  . APHTHOUS ULCERS 07/06/2007  . DYSPEPSIA, CHRONIC 07/06/2007  . RECTAL BLEEDING 07/06/2007  . ABSCESS, INNER THIGH 07/06/2007    Leah Baird 06/13/2019, 1:21 PM  [facility] [address] [place], [place], [address] Phone: [phone]   Fax:  [phone]  Name: Leah Baird MRN: [id number] Date of Birth: 1956/02/27  Raeford Razor, PT 06/13/19 1:21 PM Phone: [phone] Fax: [phone]

## 2019-06-15 ENCOUNTER — Other Ambulatory Visit: Payer: Self-pay

## 2019-06-15 ENCOUNTER — Ambulatory Visit: Payer: Medicare Other | Attending: Surgery | Admitting: Physical Therapy

## 2019-06-15 DIAGNOSIS — E782 Mixed hyperlipidemia: Secondary | ICD-10-CM | POA: Insufficient documentation

## 2019-06-15 DIAGNOSIS — R2689 Other abnormalities of gait and mobility: Secondary | ICD-10-CM | POA: Diagnosis present

## 2019-06-15 DIAGNOSIS — G8929 Other chronic pain: Secondary | ICD-10-CM | POA: Diagnosis present

## 2019-06-15 DIAGNOSIS — K219 Gastro-esophageal reflux disease without esophagitis: Secondary | ICD-10-CM | POA: Insufficient documentation

## 2019-06-15 DIAGNOSIS — M545 Low back pain: Secondary | ICD-10-CM | POA: Diagnosis not present

## 2019-06-15 DIAGNOSIS — M25551 Pain in right hip: Secondary | ICD-10-CM | POA: Diagnosis present

## 2019-06-15 DIAGNOSIS — M25552 Pain in left hip: Secondary | ICD-10-CM | POA: Diagnosis present

## 2019-06-15 DIAGNOSIS — M6281 Muscle weakness (generalized): Secondary | ICD-10-CM | POA: Diagnosis present

## 2019-06-15 NOTE — Therapy (Signed)
[place], [place], [address] Phone: [phone]   Fax:  [phone]  Physical Therapy Treatment  Patient Details  Name: Leah Baird MRN: [id number] Date of Birth: 20-Mar-1956 Referring Provider (PT):  Elonda Husky PA-C   Encounter Date: 06/15/2019  PT End of Session - 06/15/19 1235    Visit Number  3    Number of Visits  18    Date for PT Re-Evaluation  07/12/19    Authorization Type  UHC MRC/MCD    Authorization Time Period  pnote 10th visit    PT Start Time  1231    PT Stop Time  1322    PT Time Calculation (min)  51 min    Activity Tolerance  Patient limited by pain    Behavior During Therapy  Anxious;WFL for tasks assessed/performed       Past Medical History:  Diagnosis Date  . Depression   . Hypertension   . Mental disorder     Past Surgical History:  Procedure Laterality Date  . ABDOMINAL HYSTERECTOMY     partial  . cyst removed from spine    . TUBAL LIGATION Bilateral     There were no vitals filed for this visit.     [facility] Adult PT Treatment/Exercise - 06/15/19 0001      Lumbar Exercises: Stretches   Single Knee to Chest Stretch  Right;Left;2 reps;30 seconds    Piriformis Stretch  2 reps;30 seconds    Piriformis Stretch Limitations  knee opp shoulder       Lumbar Exercises: Aerobic   Nustep  5 min L5 UE and LE       Lumbar Exercises: Supine   Pelvic Tilt  5 reps    Clam  10 reps    Clam Limitations  with and without band     Bridge  10 reps      Lumbar Exercises: Sidelying   Clam  Both;10 reps    Hip Abduction  Right;Left;10 reps      Cryotherapy   Number Minutes Cryotherapy  10 Minutes    Cryotherapy Location  Hip    Type of Cryotherapy  Ice pack                  PT Long Term Goals - 06/15/19 1303      PT LONG TERM GOAL #1   Title  I with HEP and return to community exercise class ( 07/12/2019)    Status  On-going      PT LONG TERM GOAL #2    Title  She will  report 50% decrease in pain in back and hips with bed mobility and transfers ( 07/12/2019)    Status  On-going      PT LONG TERM GOAL #3   Title  demo bilat hip strength =/> 4+/5 to support ambulation without an assistive device ( 07/12/2019)    Status  On-going            Plan - 06/15/19 1236    Clinical Impression Statement  Pt with less pain today, still has increased effort to change positions. Much of her community exercise has been chair classes. Goals in progress    PT Treatment/Interventions  Patient/family education;Therapeutic activities;Moist Heat;Ultrasound;Therapeutic exercise;Passive range of motion;Spinal Manipulations;Dry needling;Joint Manipulations;Electrical Stimulation;Neuromuscular re-education;Manual techniques    PT Next Visit Plan  progress core work, modalities PRN, manual work to low back/gluts PRN.  Add to HEP, clam in sidelying.  PT Home Exercise Plan  lower trunk rotation, knee to chest, posterior pelvic tilt, bridge, hamstring and piriformis       Patient will benefit from skilled therapeutic intervention in order to improve the following deficits and impairments:     Visit Diagnosis: 1. Chronic bilateral low back pain without sciatica   2. Pain in left hip   3. Pain in right hip   4. Muscle weakness (generalized)   5. Other abnormalities of gait and mobility        Problem List Patient Active Problem List   Diagnosis Date Noted  . Spondylosis without myelopathy or radiculopathy, cervical region 04/13/2019  . Protrusion of lumbar intervertebral disc 04/12/2019  . Right shoulder pain 12/17/2016  . APHTHOUS ULCERS 07/06/2007  . DYSPEPSIA, CHRONIC 07/06/2007  . RECTAL BLEEDING 07/06/2007  . ABSCESS, INNER THIGH 07/06/2007    , 06/15/2019, 1:13 PM  [facility] [address] [place], [place], [address] Phone: [phone]   Fax:  [phone]  Name: Raynelle HighlandRachel J  Welby MRN: [id number] Date of Birth: 1956/02/11  Karie MainlandJennifer , PT 06/15/19 1:13 PM Phone: [phone] Fax: [phone]

## 2019-06-17 ENCOUNTER — Encounter

## 2019-06-21 ENCOUNTER — Other Ambulatory Visit: Payer: Self-pay

## 2019-06-21 ENCOUNTER — Ambulatory Visit (INDEPENDENT_AMBULATORY_CARE_PROVIDER_SITE_OTHER): Payer: Medicare Other | Admitting: Orthopaedic Surgery

## 2019-06-21 ENCOUNTER — Telehealth: Payer: Self-pay | Admitting: Physical Therapy

## 2019-06-21 ENCOUNTER — Ambulatory Visit: Payer: Medicare Other | Admitting: Physical Therapy

## 2019-06-21 DIAGNOSIS — M47812 Spondylosis without myelopathy or radiculopathy, cervical region: Secondary | ICD-10-CM | POA: Diagnosis not present

## 2019-06-21 DIAGNOSIS — M5126 Other intervertebral disc displacement, lumbar region: Secondary | ICD-10-CM

## 2019-06-21 NOTE — Telephone Encounter (Signed)
Pt no show for PT appointment today. They where contacted and informed of this by voicemail and They were given their next appointment date and time with instructions to call us if they can not make it to their appointments.  Elsie Ra, PT, DPT 06/21/19 5:50 PM
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
## 2019-06-24 ENCOUNTER — Ambulatory Visit: Payer: Medicare Other | Admitting: Physical Therapy

## 2019-06-24 ENCOUNTER — Other Ambulatory Visit: Payer: Self-pay

## 2019-06-24 DIAGNOSIS — M545 Low back pain: Secondary | ICD-10-CM | POA: Diagnosis not present

## 2019-06-24 DIAGNOSIS — G8929 Other chronic pain: Secondary | ICD-10-CM

## 2019-06-24 DIAGNOSIS — M6281 Muscle weakness (generalized): Secondary | ICD-10-CM

## 2019-06-24 DIAGNOSIS — M25552 Pain in left hip: Secondary | ICD-10-CM

## 2019-06-24 DIAGNOSIS — M25551 Pain in right hip: Secondary | ICD-10-CM

## 2019-06-24 DIAGNOSIS — R2689 Other abnormalities of gait and mobility: Secondary | ICD-10-CM

## 2019-06-24 NOTE — Therapy (Signed)
Leah Baird, [place], [address] Phone: [phone]   Fax:  [phone]  Physical Therapy Treatment  Patient Details  Name: Leah Baird MRN: [id number] Date of Birth: 21-Apr-1956 Referring Provider (PT):  Elonda Husky PA-C   Encounter Date: 06/24/2019  PT End of Session - 06/24/19 1206    Visit Number  4    Number of Visits  18    Date for PT Re-Evaluation  07/12/19    Authorization Type  [employer] MRC/MCD    Authorization Time Period  pnote 10th visit    PT Start Time  1115    PT Stop Time  1205   10 min on ice   PT Time Calculation (min)  50 min    Activity Tolerance  Patient limited by pain    Behavior During Therapy  Anxious;WFL for tasks assessed/performed       Past Medical History:  Diagnosis Date  . Depression   . Hypertension   . Mental disorder     Past Surgical History:  Procedure Laterality Date  . ABDOMINAL HYSTERECTOMY     partial  . cyst removed from spine    . TUBAL LIGATION Bilateral     There were no vitals filed for this visit.  Subjective Assessment - 06/24/19 1156    Subjective  Im doing a little better, I have forgotten my cane sometimes so that must be a sign I'm getting alittle better    Currently in Pain?  Yes    Pain Score  6     Pain Location  Back    Pain Orientation  Lower    Pain Descriptors / Indicators  Aching;Stabbing                       [facility] Adult PT Treatment/Exercise - 06/24/19 0001      Lumbar Exercises: Stretches   Active Hamstring Stretch  2 reps;30 seconds    Single Knee to Chest Stretch  Right;Left;2 reps;30 seconds    Lower Trunk Rotation  5 reps;10 seconds    Pelvic Tilt  20 reps    Piriformis Stretch  2 reps;30 seconds    Piriformis Stretch Limitations  knee opp shoulder       Lumbar Exercises: Aerobic   Nustep  6 min L5 UE and LE       Lumbar Exercises: Seated   Other Seated Lumbar Exercises  hamstring curls and rows  with red band  X 15 ea      Lumbar Exercises: Supine   Pelvic Tilt  20 reps    Clam Limitations  20 reps with red     Bridge  15 reps    Other Supine Lumbar Exercises  hip add ball squeeze 5 sec  X15      Cryotherapy   Number Minutes Cryotherapy  8 Minutes    Cryotherapy Location  Lumbar Spine    Type of Cryotherapy  Ice pack                  PT Long Term Goals - 06/15/19 1303      PT LONG TERM GOAL #1   Title  I with HEP and return to community exercise class ( 07/12/2019)    Status  On-going      PT LONG TERM GOAL #2   Title  She will  report 50% decrease in pain in back and hips with bed mobility and transfers (  07/12/2019)    Status  On-going      PT LONG TERM GOAL #3   Title  demo bilat hip strength =/> 4+/5 to support ambulation without an assistive device ( 07/12/2019)    Status  On-going            Plan - 06/24/19 1207    Clinical Impression Statement  Some improvements in activity tolerance and exercise progression but still limited with pain paticularly with weight bearning activities. PT will continue to progress as able.    PT Treatment/Interventions  Patient/family education;Therapeutic activities;Moist Heat;Ultrasound;Therapeutic exercise;Passive range of motion;Spinal Manipulations;Dry needling;Joint Manipulations;Electrical Stimulation;Neuromuscular re-education;Manual techniques    PT Next Visit Plan  progress core work, modalities PRN, manual work to low back/gluts PRN.  Add to HEP, clam in sidelying.    PT Home Exercise Plan  lower trunk rotation, knee to chest, posterior pelvic tilt, bridge, hamstring and piriformis       Patient will benefit from skilled therapeutic intervention in order to improve the following deficits and impairments:     Visit Diagnosis: 1. Chronic bilateral low back pain without sciatica   2. Pain in left hip   3. Pain in right hip   4. Muscle weakness (generalized)   5. Other abnormalities of gait and mobility         Problem List Patient Active Problem List   Diagnosis Date Noted  . Spondylosis without myelopathy or radiculopathy, cervical region 04/13/2019  . Protrusion of lumbar intervertebral disc 04/12/2019  . Right shoulder pain 12/17/2016  . APHTHOUS ULCERS 07/06/2007  . DYSPEPSIA, CHRONIC 07/06/2007  . RECTAL BLEEDING 07/06/2007  . ABSCESS, INNER THIGH 07/06/2007    April MansonBrian R Damari Hiltz, PT,DPT 06/24/2019, 12:08 PM  [facility] [address] [place], [place], [address] Phone: [phone]   Fax:  [phone]  [name] Date of Birth: October 14, 1956

## 2019-06-27 ENCOUNTER — Other Ambulatory Visit: Payer: Self-pay

## 2019-06-27 ENCOUNTER — Encounter: Payer: Self-pay | Admitting: Physical Therapy

## 2019-06-27 ENCOUNTER — Ambulatory Visit: Payer: Medicare Other | Admitting: Physical Therapy

## 2019-06-27 DIAGNOSIS — M6281 Muscle weakness (generalized): Secondary | ICD-10-CM

## 2019-06-27 DIAGNOSIS — M545 Low back pain: Secondary | ICD-10-CM | POA: Diagnosis not present

## 2019-06-27 DIAGNOSIS — M25551 Pain in right hip: Secondary | ICD-10-CM

## 2019-06-27 DIAGNOSIS — G8929 Other chronic pain: Secondary | ICD-10-CM

## 2019-06-27 DIAGNOSIS — R2689 Other abnormalities of gait and mobility: Secondary | ICD-10-CM

## 2019-06-27 DIAGNOSIS — M25552 Pain in left hip: Secondary | ICD-10-CM

## 2019-06-27 NOTE — Therapy (Signed)
[place], [place], [address] Phone: [phone]   Fax:  [phone]  Physical Therapy Treatment  Patient Details  Name: Leah Baird MRN: [id number] Date of Birth: [birth year] Referring Provider (PT):  Elonda Husky PA-C   Encounter Date: 06/27/2019  PT End of Session - 06/27/19 1237    Visit Number  5    Number of Visits  18    Date for PT Re-Evaluation  07/12/19    Authorization Type  UHC MRC/MCD    PT Start Time  1232    PT Stop Time  1322    PT Time Calculation (min)  50 min    Activity Tolerance  Patient tolerated treatment well    Behavior During Therapy  Anxious;WFL for tasks assessed/performed       Past Medical History:  Diagnosis Date  . Depression   . Hypertension   . Mental disorder     Past Surgical History:  Procedure Laterality Date  . ABDOMINAL HYSTERECTOMY     partial  . cyst removed from spine    . TUBAL LIGATION Bilateral     There were no vitals filed for this visit.  Subjective Assessment - 06/27/19 1236    Subjective  My back is a bit better.  I stay on my medication.  Has a sore throat but its cause I ate something hot.    Currently in Pain?  Yes    Pain Score  7     Pain Location  Back    Pain Orientation  Lower    Pain Descriptors / Indicators  Aching    Pain Type  Chronic pain    Pain Onset  More than a month ago    Pain Frequency  Constant    Aggravating Factors   weather, activity    Pain Relieving Factors  meds         [facility] Adult PT Treatment/Exercise - 06/27/19 0001      Lumbar Exercises: Stretches   Active Hamstring Stretch  2 reps;30 seconds    Single Knee to Chest Stretch  Right;Left;2 reps;30 seconds    Lower Trunk Rotation Limitations  10 x 10 sec     Pelvic Tilt  20 reps    Figure 4 Stretch  2 reps;30 seconds    Other Lumbar Stretch Exercise  Rt LE decompression       Lumbar Exercises: Aerobic   Nustep  5 min L5       Lumbar  Exercises: Standing   Heel Raises  15 reps    Functional Squats  10 reps      Lumbar Exercises: Seated   Sit to Stand  20 reps      Lumbar Exercises: Supine   Bent Knee Raise  10 reps    Bridge  10 reps      Knee/Hip Exercises: Stretches   Active Hamstring Stretch  Both;2 reps      Knee/Hip Exercises: Sidelying   Clams  x 20       Cryotherapy   Number Minutes Cryotherapy  10 Minutes    Cryotherapy Location  Lumbar Spine    Type of Cryotherapy  Ice pack             PT Education - 06/27/19 1259    Education Details  increasing activity levels    Person(s) Educated  Patient    Methods  Explanation    Comprehension  Verbalized understanding  PT Long Term Goals - 06/27/19 1255      PT LONG TERM GOAL #1   Title  I with HEP and return to community exercise class ( 07/12/2019)    Status  On-going      PT LONG TERM GOAL #2   Title  She will  report 50% decrease in pain in back and hips with bed mobility and transfers ( 07/12/2019)    Baseline  medication is always on board so it is hard to telll    Status  On-going      PT LONG TERM GOAL #3   Title  demo bilat hip strength =/> 4+/5 to support ambulation without an assistive device ( 07/12/2019)    Status  On-going      PT LONG TERM GOAL #4   Title  tolerate core work with no more than 3/10 pain in her back ( 07/12/2019)    Status  On-going      PT LONG TERM GOAL #5   Title  improve FOTO =/< 52% limited ( 07/12/2019)    Status  Unable to assess            Plan - 06/27/19 1238    Clinical Impression Statement  Patient reports an improvement in her function but her pain is always the same because of medicine.  She feels more flexible.  Uses the buggy at the store most of the time (scooter). Mentions symptoms during session nt related to her back: sore throat, stuffy ears, increased LE swelling and head pressure with hamstring in supine.  Recommend she see her MD for these issues.    PT  Treatment/Interventions  Patient/family education;Therapeutic activities;Moist Heat;Ultrasound;Therapeutic exercise;Passive range of motion;Spinal Manipulations;Dry needling;Joint Manipulations;Electrical Stimulation;Neuromuscular re-education;Manual techniques    PT Next Visit Plan  progress core work, modalities PRN, manual work to low back/gluts PRN.  Add to HEP, clam in sidelying.    PT Home Exercise Plan  lower trunk rotation, knee to chest, posterior pelvic tilt, bridge, hamstring and piriformis    Consulted and Agree with Plan of Care  Patient       Patient will benefit from skilled therapeutic intervention in order to improve the following deficits and impairments:  Abnormal gait, Decreased range of motion, Difficulty walking, Obesity, Increased muscle spasms, Pain, Improper body mechanics, Decreased strength  Visit Diagnosis: 1. Chronic bilateral low back pain without sciatica   2. Pain in left hip   3. Pain in right hip   4. Muscle weakness (generalized)   5. Other abnormalities of gait and mobility        Problem List Patient Active Problem List   Diagnosis Date Noted  . Spondylosis without myelopathy or radiculopathy, cervical region 04/13/2019  . Protrusion of lumbar intervertebral disc 04/12/2019  . Right shoulder pain 12/17/2016  . APHTHOUS ULCERS 07/06/2007  . DYSPEPSIA, CHRONIC 07/06/2007  . RECTAL BLEEDING 07/06/2007  . ABSCESS, INNER THIGH 07/06/2007    , 06/27/2019, 2:09 PM  [facility] [address] [place], [place], [address] Phone: [phone]   Fax:  [phone]  [name] Date of Birth: [birth year]  Karie MainlandJennifer , PT 06/27/19 2:09 PM Phone: [phone] Fax: [phone]

## 2019-06-29 ENCOUNTER — Encounter: Payer: Self-pay | Admitting: Physical Therapy

## 2019-06-29 ENCOUNTER — Other Ambulatory Visit: Payer: Self-pay

## 2019-06-29 ENCOUNTER — Ambulatory Visit: Payer: Medicare Other | Admitting: Physical Therapy

## 2019-06-29 DIAGNOSIS — M25552 Pain in left hip: Secondary | ICD-10-CM

## 2019-06-29 DIAGNOSIS — M25551 Pain in right hip: Secondary | ICD-10-CM

## 2019-06-29 DIAGNOSIS — R2689 Other abnormalities of gait and mobility: Secondary | ICD-10-CM

## 2019-06-29 DIAGNOSIS — M6281 Muscle weakness (generalized): Secondary | ICD-10-CM

## 2019-06-29 DIAGNOSIS — G8929 Other chronic pain: Secondary | ICD-10-CM

## 2019-06-29 DIAGNOSIS — M545 Low back pain: Secondary | ICD-10-CM | POA: Diagnosis not present

## 2019-06-29 NOTE — Therapy (Signed)
[facility] [address] [place], [place], [address] Phone: [phone]   Fax:  [phone]  Physical Therapy Treatment  Patient Details  Name: Leah HighlandRachel J Baird MRN: [id number] Date of Birth: [birth year] Referring Provider (PT):  Alvin CritchleyJames Ovwens PA-C   Encounter Date: 06/29/2019  PT End of Session - 06/29/19 1235    Visit Number  6    Number of Visits  18    Date for PT Re-Evaluation  07/12/19    Authorization Type  UHC MRC/MCD    PT Start Time  1231    PT Stop Time  1317    PT Time Calculation (min)  46 min    Activity Tolerance  Patient limited by pain    Behavior During Therapy  Anxious;WFL for tasks assessed/performed       Past Medical History:  Diagnosis Date  . Depression   . Hypertension   . Mental disorder     Past Surgical History:  Procedure Laterality Date  . ABDOMINAL HYSTERECTOMY     partial  . cyst removed from spine    . TUBAL LIGATION Bilateral     There were no vitals filed for this visit.  Subjective Assessment - 06/29/19 1234    Subjective  Patient hurting today.  Patient says someone is breaking in to her house and stealing her stuff.  Sees her Primary MD about her LLE swelling.    Currently in Pain?  Yes    Pain Score  9     Pain Location  Back         [facility] Adult PT Treatment/Exercise - 06/29/19 0001      Self-Care   Self-Care  Heat/Ice Application;Other Self-Care Comments    Heat/Ice Application  heat for relaxation    Other Self-Care Comments   anatomy, nerve pain ,referred       Lumbar Exercises: Stretches   Single Knee to Chest Stretch  Right;Left;2 reps;30 seconds    Lower Trunk Rotation Limitations  10 x 10 sec     Pelvic Tilt  20 reps      Lumbar Exercises: Sidelying   Other Sidelying Lumbar Exercises  sidelying stretch for Rt lumbar, hip with manual       Moist Heat Therapy   Number Minutes Moist Heat  15 Minutes    Moist Heat Location  Lumbar Spine   during mat  stretching      Electrical Stimulation   Electrical Stimulation Location  lumbar and Rt hip     Electrical Stimulation Action  IFC     Electrical Stimulation Parameters  to tol    Electrical Stimulation Goals  Pain      Manual Therapy   Manual therapy comments  LAD Rt LE x 5, heel lift for minor LLD in LLE , light caudal pressure to elongate Rt side.  Symptoms relieved                   PT Long Term Goals - 06/27/19 1255      PT LONG TERM GOAL #1   Title  I with HEP and return to community exercise class ( 07/12/2019)    Status  On-going      PT LONG TERM GOAL #2   Title  She will  report 50% decrease in pain in back and hips with bed mobility and transfers ( 07/12/2019)    Baseline  medication is always on board so it is hard to telll  Status  On-going      PT LONG TERM GOAL #3   Title  demo bilat hip strength =/> 4+/5 to support ambulation without an assistive device ( 07/12/2019)    Status  On-going      PT LONG TERM GOAL #4   Title  tolerate core work with no more than 3/10 pain in her back ( 07/12/2019)    Status  On-going      PT LONG TERM GOAL #5   Title  improve FOTO =/< 52% limited ( 07/12/2019)    Status  Unable to assess            Plan - 06/29/19 1236    Clinical Impression Statement  Pain with severe pain today.  Sees PA today about LE swelling but not necessarily back pain.  Did not tolerate mat stretches well.  Rt trendelenburg with gait, added a small heel lift to L shoe, she may switch it to the Rt.  [place]anas mostly due to weakness due to pain inhibition.    PT Treatment/Interventions  Patient/family education;Therapeutic activities;Moist Heat;Ultrasound;Therapeutic exercise;Passive range of motion;Spinal Manipulations;Dry needling;Joint Manipulations;Electrical Stimulation;Neuromuscular re-education;Manual techniques    PT Next Visit Plan  progress core work, modalities PRN, manual work to low back/gluts PRN.  Add to HEP, clam in sidelying.    PT  Home Exercise Plan  lower trunk rotation, knee to chest, posterior pelvic tilt, bridge, hamstring and piriformis       Patient will benefit from skilled therapeutic intervention in order to improve the following deficits and impairments:  Abnormal gait, Decreased range of motion, Difficulty walking, Obesity, Increased muscle spasms, Pain, Improper body mechanics, Decreased strength  Visit Diagnosis: 1. Chronic bilateral low back pain without sciatica   2. Pain in left hip   3. Pain in right hip   4. Muscle weakness (generalized)   5. Other abnormalities of gait and mobility        Problem List Patient Active Problem List   Diagnosis Date Noted  . Spondylosis without myelopathy or radiculopathy, cervical region 04/13/2019  . Protrusion of lumbar intervertebral disc 04/12/2019  . Right shoulder pain 12/17/2016  . APHTHOUS ULCERS 07/06/2007  . DYSPEPSIA, CHRONIC 07/06/2007  . RECTAL BLEEDING 07/06/2007  . ABSCESS, INNER THIGH 07/06/2007    Shaneese Tait 06/29/2019, 1:28 PM  [facility] [address] [place], [place], [address] Phone: [phone]   Fax:  [phone]  Name: Leah Baird MRN: [id number] Date of Birth: 06-03-1956  Raeford Razor, PT 06/29/19 1:28 PM Phone: [phone] Fax: [phone]

## 2019-06-29 NOTE — Patient Instructions (Signed)
Sidelying Quadratus Lumborum Stretch on Table reps: 3 sets: 1 hold: 30 daily: 2  weekly: 7      Exercise image step 1   Exercise image step 2  Setup  Begin lying on your side on a table or bed with your knees bent at the edge of the table. Movement  Slowly lower your feet off of the table until you feel a stretch in the side of your lower back and hold. Tip  Make sure to continue breathing evenly and keep your body relaxed during the stretch.

## 2019-07-01 ENCOUNTER — Ambulatory Visit: Payer: Medicare Other | Admitting: Physical Therapy

## 2019-07-01 ENCOUNTER — Encounter: Payer: Self-pay | Admitting: Physical Therapy

## 2019-07-01 ENCOUNTER — Other Ambulatory Visit: Payer: Self-pay

## 2019-07-01 DIAGNOSIS — M25551 Pain in right hip: Secondary | ICD-10-CM

## 2019-07-01 DIAGNOSIS — R2689 Other abnormalities of gait and mobility: Secondary | ICD-10-CM

## 2019-07-01 DIAGNOSIS — G8929 Other chronic pain: Secondary | ICD-10-CM

## 2019-07-01 DIAGNOSIS — M25552 Pain in left hip: Secondary | ICD-10-CM

## 2019-07-01 DIAGNOSIS — M545 Low back pain: Secondary | ICD-10-CM | POA: Diagnosis not present

## 2019-07-01 DIAGNOSIS — M6281 Muscle weakness (generalized): Secondary | ICD-10-CM

## 2019-07-01 NOTE — Therapy (Signed)
[facility], [place], [address] Phone: [phone]   Fax:  [phone]  Physical Therapy Treatment  Patient Details  Name: Leah Baird MRN: [id number] Date of Birth: June 25, 1956 Referring Provider (PT):  Elonda Husky PA-C   Encounter Date: 07/01/2019  PT End of Session - 07/01/19 1139    Visit Number  7    Number of Visits  18    Date for PT Re-Evaluation  07/12/19    Authorization Type  [employer] MRC/MCD    PT Start Time  6734    PT Stop Time  1220    PT Time Calculation (min)  46 min       Past Medical History:  Diagnosis Date  . Depression   . Hypertension   . Mental disorder     Past Surgical History:  Procedure Laterality Date  . ABDOMINAL HYSTERECTOMY     partial  . cyst removed from spine    . TUBAL LIGATION Bilateral     There were no vitals filed for this visit.  Subjective Assessment - 07/01/19 1137    Subjective  3/10 pain today because I took my meds, I did not have meds last session and I hurt after and had to go home and lay down.    Currently in Pain?  Yes    Pain Score  3     Pain Location  Back    Pain Orientation  Lower    Pain Descriptors / Indicators  Aching                       [facility] Adult PT Treatment/Exercise - 07/01/19 0001      Lumbar Exercises: Stretches   Single Knee to Chest Stretch  Right;Left;2 reps;30 seconds    Lower Trunk Rotation Limitations  10 x 10 sec     Piriformis Stretch  2 reps;20 seconds    Figure 4 Stretch  2 reps;20 seconds      Lumbar Exercises: Supine   Ab Set  10 reps    AB Set Limitations  10 breaths maintaining badominal draw in     Pelvic Tilt  15 reps    Clam  20 reps    Clam Limitations  bilat then unilateral     Bent Knee Raise  20 reps      Moist Heat Therapy   Number Minutes Moist Heat  15 Minutes    Moist Heat Location  Lumbar Spine      Electrical Stimulation   Electrical Stimulation Location  lumbar  and Rt hip     Electrical Stimulation Action  IFc    Electrical Stimulation Parameters  tol tolerance     Electrical Stimulation Goals  Pain                  PT Long Term Goals - 06/27/19 1255      PT LONG TERM GOAL #1   Title  I with HEP and return to community exercise class ( 07/12/2019)    Status  On-going      PT LONG TERM GOAL #2   Title  She will  report 50% decrease in pain in back and hips with bed mobility and transfers ( 07/12/2019)    Baseline  medication is always on board so it is hard to telll    Status  On-going      PT LONG TERM GOAL #3   Title  demo bilat hip strength =/> 4+/5 to support ambulation without an assistive device ( 07/12/2019)    Status  On-going      PT LONG TERM GOAL #4   Title  tolerate core work with no more than 3/10 pain in her back ( 07/12/2019)    Status  On-going      PT LONG TERM GOAL #5   Title  improve FOTO =/< 52% limited ( 07/12/2019)    Status  Unable to assess            Plan - 07/01/19 1216    Clinical Impression Statement  Pt reports pain was worse after last session due to no taking meds. Today she is 3/10 with medication. Focused core strength and trunk/ hip stretching. IFC/HMP repeated with care taken to include  hamstring insertion on right.    PT Next Visit Plan  FOTO; progress core work, modalities PRN, manual work to low back/gluts PRN.  Add to HEP, clam in sidelying.    PT Home Exercise Plan  lower trunk rotation, knee to chest, posterior pelvic tilt, bridge, hamstring and piriformis    Consulted and Agree with Plan of Care  Patient       Patient will benefit from skilled therapeutic intervention in order to improve the following deficits and impairments:  Abnormal gait, Decreased range of motion, Difficulty walking, Obesity, Increased muscle spasms, Pain, Improper body mechanics, Decreased strength  Visit Diagnosis: 1. Pain in left hip   2. Chronic bilateral low back pain without sciatica   3. Pain in  right hip   4. Muscle weakness (generalized)   5. Other abnormalities of gait and mobility        Problem List Patient Active Problem List   Diagnosis Date Noted  . Spondylosis without myelopathy or radiculopathy, cervical region 04/13/2019  . Protrusion of lumbar intervertebral disc 04/12/2019  . Right shoulder pain 12/17/2016  . APHTHOUS ULCERS 07/06/2007  . DYSPEPSIA, CHRONIC 07/06/2007  . RECTAL BLEEDING 07/06/2007  . ABSCESS, INNER THIGH 07/06/2007    Sherrie Mustacheonoho, Myley Bahner McGee, VirginiaPTA 07/01/2019, 12:29 PM  [facility] [address] [place], [place], [address] Phone: [phone]   Fax:  [phone]  [name] Date of Birth: [birth year]

## 2019-07-04 ENCOUNTER — Other Ambulatory Visit: Payer: Self-pay

## 2019-07-04 ENCOUNTER — Ambulatory Visit: Payer: Medicare Other | Admitting: Physical Therapy

## 2019-07-04 DIAGNOSIS — M6281 Muscle weakness (generalized): Secondary | ICD-10-CM

## 2019-07-04 DIAGNOSIS — M25551 Pain in right hip: Secondary | ICD-10-CM

## 2019-07-04 DIAGNOSIS — M25552 Pain in left hip: Secondary | ICD-10-CM

## 2019-07-04 DIAGNOSIS — M545 Low back pain: Secondary | ICD-10-CM | POA: Diagnosis not present

## 2019-07-04 DIAGNOSIS — G8929 Other chronic pain: Secondary | ICD-10-CM

## 2019-07-04 DIAGNOSIS — R2689 Other abnormalities of gait and mobility: Secondary | ICD-10-CM

## 2019-07-04 NOTE — Therapy (Signed)
[facility], [place], [address] Phone: [phone]   Fax:  [phone]  Physical Therapy Treatment  Patient Details  [name] Date of Birth: Dec 24, 1955 Referring Provider (PT):  Elonda Husky PA-C   Encounter Date: 07/04/2019  PT End of Session - 07/04/19 1249    Visit Number  8    Number of Visits  18    Date for PT Re-Evaluation  07/12/19    Authorization Type  UHC MRC/MCD    PT Start Time  1232    PT Stop Time  1327    PT Time Calculation (min)  55 min    Activity Tolerance  Patient limited by pain    Behavior During Therapy  Anxious;WFL for tasks assessed/performed       Past Medical History:  Diagnosis Date  . Depression   . Hypertension   . Mental disorder     Past Surgical History:  Procedure Laterality Date  . ABDOMINAL HYSTERECTOMY     partial  . cyst removed from spine    . TUBAL LIGATION Bilateral     There were no vitals filed for this visit.  Subjective Assessment - 07/04/19 1238    Subjective  I dont have a blood clot. 5/10 back pain. Had a low key weekend.  Lift in is the LLE.  I still feel off balance.    Currently in Pain?  Yes    Pain Score  5     Pain Location  Back    Pain Orientation  Right    Pain Descriptors / Indicators  Dull;Aching    Pain Type  Chronic pain    Pain Radiating Towards  Rt LE thigh, post and calf    Pain Onset  More than a month ago    Pain Frequency  Constant    Aggravating Factors   weather , activity    Pain Relieving Factors  medicine         [facility] PT Assessment - 07/04/19 0001      Observation/Other Assessments   Focus on Therapeutic Outcomes (FOTO)   65%          [facility] Adult PT Treatment/Exercise - 07/04/19 0001      Lumbar Exercises: Stretches   Active Hamstring Stretch  3 reps;30 seconds    Piriformis Stretch  2 reps;30 seconds      Lumbar Exercises: Aerobic   Nustep  6 min L6 UE and LE        Lumbar Exercises: Sidelying   Clam  Both;20 reps    Hip Abduction  Both;10 reps      Knee/Hip Exercises: Standing   Hip Abduction  Stengthening;Both;1 set;15 reps    Abduction Limitations  off step     Lateral Step Up  Right;3 sets;10 reps    Lateral Step Up Limitations  6 inch       Moist Heat Therapy   Number Minutes Moist Heat  15 Minutes    Moist Heat Location  Lumbar Spine      Electrical Stimulation   Electrical Stimulation Location  lumbar and Rt hip     Electrical Stimulation Action  IFC    Electrical Stimulation Parameters  to tolerance     Electrical Stimulation Goals  Pain            PT Long Term Goals - 06/27/19 1255      PT LONG TERM GOAL #1  Title  I with HEP and return to community exercise class ( 07/12/2019)    Status  On-going      PT LONG TERM GOAL #2   Title  She will  report 50% decrease in pain in back and hips with bed mobility and transfers ( 07/12/2019)    Baseline  medication is always on board so it is hard to telll    Status  On-going      PT LONG TERM GOAL #3   Title  demo bilat hip strength =/> 4+/5 to support ambulation without an assistive device ( 07/12/2019)    Status  On-going      PT LONG TERM GOAL #4   Title  tolerate core work with no more than 3/10 pain in her back ( 07/12/2019)    Status  On-going      PT LONG TERM GOAL #5   Title  improve FOTO =/< 52% limited ( 07/12/2019)    Status  Unable to assess            Plan - 07/04/19 1252    Clinical Impression Statement  Pt with Rt trendelenburg , took out both lifts and transferred over the the Rt LE to level pelvis. Improved 6% since initial eval.  Unable to say if the lift in R LE made a differnence for her with gait.    PT Treatment/Interventions  Patient/family education;Therapeutic activities;Moist Heat;Ultrasound;Therapeutic exercise;Passive range of motion;Spinal Manipulations;Dry needling;Joint Manipulations;Electrical Stimulation;Neuromuscular re-education;Manual  techniques    PT Next Visit Plan  progress core work, modalities PRN, manual work to low back/gluts PRN. Add clam?    PT Home Exercise Plan  lower trunk rotation, knee to chest, posterior pelvic tilt, bridge, hamstring and piriformis , QL    Consulted and Agree with Plan of Care  Patient       Patient will benefit from skilled therapeutic intervention in order to improve the following deficits and impairments:  Abnormal gait, Decreased range of motion, Difficulty walking, Obesity, Increased muscle spasms, Pain, Improper body mechanics, Decreased strength  Visit Diagnosis: 1. Pain in left hip   2. Chronic bilateral low back pain without sciatica   3. Pain in right hip   4. Muscle weakness (generalized)   5. Other abnormalities of gait and mobility        Problem List Patient Active Problem List   Diagnosis Date Noted  . Spondylosis without myelopathy or radiculopathy, cervical region 04/13/2019  . Protrusion of lumbar intervertebral disc 04/12/2019  . Right shoulder pain 12/17/2016  . APHTHOUS ULCERS 07/06/2007  . DYSPEPSIA, CHRONIC 07/06/2007  . RECTAL BLEEDING 07/06/2007  . ABSCESS, INNER THIGH 07/06/2007    Toryn Dewalt 07/04/2019, 1:59 PM  [facility] [address] [place], [place], [address] Phone: [phone]   Fax:  [phone]  Name: Leah Baird MRN: [id number] Date of Birth: 1956-05-05  Karie MainlandJennifer Dotty Gonzalo, PT 07/04/19 1:59 PM Phone: [phone] Fax: [phone]

## 2019-07-06 ENCOUNTER — Encounter: Payer: Self-pay | Admitting: Physical Therapy

## 2019-07-06 ENCOUNTER — Other Ambulatory Visit: Payer: Self-pay

## 2019-07-06 ENCOUNTER — Ambulatory Visit: Payer: Medicare Other | Admitting: Physical Therapy

## 2019-07-06 DIAGNOSIS — G8929 Other chronic pain: Secondary | ICD-10-CM

## 2019-07-06 DIAGNOSIS — M25552 Pain in left hip: Secondary | ICD-10-CM

## 2019-07-06 DIAGNOSIS — M25551 Pain in right hip: Secondary | ICD-10-CM

## 2019-07-06 DIAGNOSIS — M545 Low back pain: Secondary | ICD-10-CM | POA: Diagnosis not present

## 2019-07-06 DIAGNOSIS — R2689 Other abnormalities of gait and mobility: Secondary | ICD-10-CM

## 2019-07-06 DIAGNOSIS — M6281 Muscle weakness (generalized): Secondary | ICD-10-CM

## 2019-07-06 NOTE — Therapy (Signed)
[place], [place], [address] Phone: [phone]   Fax:  [phone]  Physical Therapy Treatment/Renewal   Patient Details  Name: Leah Baird MRN: [id number] Date of Birth: [birth year] Referring Provider (PT):  Elonda Husky PA-C   Encounter Date: 07/06/2019  PT End of Session - 07/06/19 1236    Visit Number  9    Number of Visits  18    Date for PT Re-Evaluation  08/17/19    Authorization Type  UHC MRC/MCD    PT Start Time  3762    PT Stop Time  1325    PT Time Calculation (min)  54 min    Activity Tolerance  Other (comment)   increased pain   Behavior During Therapy  [facility] for tasks assessed/performed       Past Medical History:  Diagnosis Date  . Depression   . Hypertension   . Mental disorder     Past Surgical History:  Procedure Laterality Date  . ABDOMINAL HYSTERECTOMY     partial  . cyst removed from spine    . TUBAL LIGATION Bilateral     There were no vitals filed for this visit.  Subjective Assessment - 07/06/19 1235    Subjective  No back pain right now, premedicated.  Neck is a little tight.          [facility] Adult PT Treatment/Exercise - 07/06/19 0001      Lumbar Exercises: Stretches   Single Knee to Chest Stretch  Right;Left;2 reps;30 seconds    Piriformis Stretch  2 reps;30 seconds      Lumbar Exercises: Aerobic   Nustep  6 min L7 UE and LE       Lumbar Exercises: Standing   Heel Raises  10 reps    Functional Squats  10 reps    Side Lunge  10 reps    Side Lunge Limitations  holding chair     Push / Pull Sled  resisted band walking XTS forward and back x 3 (20 feet) and lateral x 3 [address]. Needed min A due to body sway and decreased control coming back to anchor       Lumbar Exercises: Seated   Sit to Stand  10 reps    Sit to Stand Limitations  ball no UE       Lumbar Exercises: Sidelying   Clam  Both;20 reps    Hip Abduction  Both;10 reps      Knee/Hip  Exercises: Supine   Knee Flexion  Strengthening;Both;1 set;10 reps    Knee Flexion Limitations  hip and knee with red loop on       Cryotherapy   Number Minutes Cryotherapy  10 Minutes    Cryotherapy Location  Lumbar Spine    Type of Cryotherapy  Ice pack                  PT Long Term Goals - 07/06/19 1257      PT LONG TERM GOAL #1   Title  I with HEP and return to community exercise class ( 07/12/2019)    Status  On-going      PT LONG TERM GOAL #2   Title  She will report 50% decrease in pain in back and hips with bed mobility and transfers ( 07/12/2019)    Baseline  medication is always on board so it is hard to tell, having an injection this week    Status  On-going      PT LONG TERM GOAL #3   Title  demo bilat hip strength =/> 4+/5 to support ambulation without an assistive device ( 07/12/2019)    Baseline  hip abduction 3/5 to 3+/5    Target Date  08/17/19      PT LONG TERM GOAL #4   Title  tolerate core work with no more than 3/10 pain in her back ( 07/12/2019)    Target Date  08/17/19      PT LONG TERM GOAL #5   Title  improve FOTO =/< 52% limited ( 07/12/2019)    Time  4    Period  Weeks    Status  New    Target Date  08/17/19            Plan - 07/06/19 1236    Clinical Impression Statement  Patient renewed for more visits  It is hard to say she is functionally better due to the timing of her medicine.  When she takes her medicine she has relief but pain increases with exercises.    Personal Factors and Comorbidities  Age;Past/Current Experience;Behavior Pattern;Comorbidity 3+    Examination-Activity Limitations  Bed Mobility;Stand;Locomotion Level;Transfers    PT Treatment/Interventions  Patient/family education;Therapeutic activities;Moist Heat;Ultrasound;Therapeutic exercise;Passive range of motion;Spinal Manipulations;Dry needling;Joint Manipulations;Electrical Stimulation;Neuromuscular re-education;Manual techniques    PT Next Visit Plan  progress  core work, modalities PRN, manual work to low back/gluts PRN. Add clam?    PT Home Exercise Plan  lower trunk rotation, knee to chest, posterior pelvic tilt, bridge, hamstring and piriformis , QL    Consulted and Agree with Plan of Care  Patient       Patient will benefit from skilled therapeutic intervention in order to improve the following deficits and impairments:  Abnormal gait, Decreased range of motion, Difficulty walking, Obesity, Increased muscle spasms, Pain, Improper body mechanics, Decreased strength  Visit Diagnosis: 1. Pain in left hip   2. Pain in right hip   3. Chronic bilateral low back pain without sciatica   4. Muscle weakness (generalized)   5. Other abnormalities of gait and mobility        Problem List Patient Active Problem List   Diagnosis Date Noted  . Spondylosis without myelopathy or radiculopathy, cervical region 04/13/2019  . Protrusion of lumbar intervertebral disc 04/12/2019  . Right shoulder pain 12/17/2016  . APHTHOUS ULCERS 07/06/2007  . DYSPEPSIA, CHRONIC 07/06/2007  . RECTAL BLEEDING 07/06/2007  . ABSCESS, INNER THIGH 07/06/2007    Leah Baird 07/06/2019, 1:19 PM  [facility] [address] [place], [place], [address] Phone: [phone]   Fax:  [phone]  Name: Leah Baird MRN: [id number] Date of Birth: 09-17-1956  Karie MainlandJennifer Barnard Sharps, PT 07/06/19 1:20 PM Phone: [phone] Fax: [phone]

## 2019-07-07 ENCOUNTER — Encounter: Payer: PRIVATE HEALTH INSURANCE | Admitting: Physical Medicine and Rehabilitation

## 2019-07-08 ENCOUNTER — Ambulatory Visit: Payer: Medicare Other | Admitting: Physical Therapy

## 2019-07-08 ENCOUNTER — Other Ambulatory Visit: Payer: Self-pay

## 2019-07-08 DIAGNOSIS — M6281 Muscle weakness (generalized): Secondary | ICD-10-CM

## 2019-07-08 DIAGNOSIS — M545 Low back pain: Secondary | ICD-10-CM | POA: Diagnosis not present

## 2019-07-08 DIAGNOSIS — G8929 Other chronic pain: Secondary | ICD-10-CM

## 2019-07-08 DIAGNOSIS — M25551 Pain in right hip: Secondary | ICD-10-CM

## 2019-07-08 DIAGNOSIS — M25552 Pain in left hip: Secondary | ICD-10-CM

## 2019-07-08 DIAGNOSIS — R2689 Other abnormalities of gait and mobility: Secondary | ICD-10-CM

## 2019-07-08 NOTE — Therapy (Signed)
[facility] [address] [place], [place], [address] Phone: [phone]   Fax:  [phone]  Physical Therapy Treatment/MCR progress note Progress Note reporting period date 05/31/19 to 07/08/19   See below for objective and subjective measurements relating to patients progress with PT.   Patient Details  Name: Leah Baird MRN: [id number] Date of Birth: May 15, 1956 Referring Provider (PT):  Alvin CritchleyJames Ovwens PA-C   Encounter Date: 07/08/2019  PT End of Session - 07/08/19 1118    Visit Number  10    Number of Visits  18    Date for PT Re-Evaluation  08/17/19    Authorization Type  [employer] MRC/MCD, did progress note at 10    PT Start Time  1107   pt 7 min late   PT Stop Time  1145    PT Time Calculation (min)  38 min    Activity Tolerance  Other (comment)   increased pain   Behavior During Therapy  WFL for tasks assessed/performed       Past Medical History:  Diagnosis Date  . Depression   . Hypertension   . Mental disorder     Past Surgical History:  Procedure Laterality Date  . ABDOMINAL HYSTERECTOMY     partial  . cyst removed from spine    . TUBAL LIGATION Bilateral     There were no vitals filed for this visit.  Subjective Assessment - 07/08/19 1118    Subjective  Pain only 1.5 out of 10, but I took my pain medicine    Pertinent History  had PT in 2016 and did well with it then.    How long can you sit comfortably?  < 5'    How long can you walk comfortably?  limited to house hold, having to use a can now    Diagnostic tests  xrays    Patient Stated Goals  get back to her life, walk without a cane, go to exercise classes at senior center    Pain Onset  More than a month ago         [facility] - 07/08/19 0001      Assessment   Medical Diagnosis  spondylosis, and SIJ pain    Referring Provider (PT)   Alvin CritchleyJames Ovwens PA-C                   [facility] Adult [facility] Treatment/Exercise - 07/08/19  0001      Lumbar Exercises: Stretches   Single Knee to Chest Stretch  Right;Left;2 reps;30 seconds    Lower Trunk Rotation  5 reps;10 seconds    Piriformis Stretch  2 reps;30 seconds      Lumbar Exercises: Aerobic   Recumbent Bike  6 min L1 with heat on neck      Lumbar Exercises: Standing   Heel Raises  10 reps    Functional Squats  10 reps    Other Standing Lumbar Exercises  sidestepping with green band at counter up/down  X3      Lumbar Exercises: Seated   Sit to Stand  10 reps    Other Seated Lumbar Exercises  upper trap stretch 30 sec  X3      Lumbar Exercises: Supine   Clam  20 reps    Clam Limitations  green    Bridge  15 reps                  PT Long Term Goals -  07/08/19 1144      PT LONG TERM GOAL #1   Title  I with HEP and return to community exercise class ( 07/12/2019)    Status  On-going      PT LONG TERM GOAL #2   Title  She will report 50% decrease in pain in back and hips with bed mobility and transfers ( 07/12/2019)    Baseline  medication is always on board so it is hard to tell, having an injection this week    Status  On-going      PT LONG TERM GOAL #3   Title  demo bilat hip strength =/> 4+/5 to support ambulation without an assistive device ( 07/12/2019)    Baseline  hip abduction 3/5 to 3+/5    Status  On-going      PT LONG TERM GOAL #4   Title  tolerate core work with no more than 3/10 pain in her back ( 07/12/2019)    Status  On-going      PT LONG TERM GOAL #5   Title  improve FOTO =/< 52% limited ( 07/12/2019)    Time  4    Period  Weeks    Status  On-going            Plan - 07/08/19 1143    Clinical Impression Statement  It is hard to say if she is having any less pain as this may be masked by meds, however she is at least improving strength, and activity tolerance some. PT will continue to progress as able toward her functional goals    Personal Factors and Comorbidities  Age;Past/Current Experience;Behavior  Pattern;Comorbidity 3+    Examination-Activity Limitations  Bed Mobility;Stand;Locomotion Level;Transfers    PT Treatment/Interventions  Patient/family education;Therapeutic activities;Moist Heat;Ultrasound;Therapeutic exercise;Passive range of motion;Spinal Manipulations;Dry needling;Joint Manipulations;Electrical Stimulation;Neuromuscular re-education;Manual techniques    PT Next Visit Plan  progress core work, modalities PRN, manual work to low back/gluts PRN. Add clam?    PT Home Exercise Plan  lower trunk rotation, knee to chest, posterior pelvic tilt, bridge, hamstring and piriformis , QL    Consulted and Agree with Plan of Care  Patient       Patient will benefit from skilled therapeutic intervention in order to improve the following deficits and impairments:  Abnormal gait, Decreased range of motion, Difficulty walking, Obesity, Increased muscle spasms, Pain, Improper body mechanics, Decreased strength  Visit Diagnosis: 1. Pain in left hip   2. Pain in right hip   3. Chronic bilateral low back pain without sciatica   4. Muscle weakness (generalized)   5. Other abnormalities of gait and mobility        Problem List Patient Active Problem List   Diagnosis Date Noted  . Spondylosis without myelopathy or radiculopathy, cervical region 04/13/2019  . Protrusion of lumbar intervertebral disc 04/12/2019  . Right shoulder pain 12/17/2016  . APHTHOUS ULCERS 07/06/2007  . DYSPEPSIA, CHRONIC 07/06/2007  . RECTAL BLEEDING 07/06/2007  . ABSCESS, INNER THIGH 07/06/2007    Debbe Odea 07/08/2019, 11:46 AM  [facility] [address] [place], [place], [address] Phone: [phone]   Fax:  [phone]  Name: Leah Baird MRN: [id number] Date of Birth: 08-16-1956

## 2019-07-11 ENCOUNTER — Ambulatory Visit: Payer: Medicare Other | Admitting: Physical Therapy

## 2019-07-11 ENCOUNTER — Other Ambulatory Visit: Payer: Self-pay

## 2019-07-11 ENCOUNTER — Encounter: Payer: Self-pay | Admitting: Physical Therapy

## 2019-07-11 ENCOUNTER — Telehealth: Payer: Self-pay | Admitting: Physical Therapy

## 2019-07-11 DIAGNOSIS — G8929 Other chronic pain: Secondary | ICD-10-CM

## 2019-07-11 DIAGNOSIS — R2689 Other abnormalities of gait and mobility: Secondary | ICD-10-CM

## 2019-07-11 DIAGNOSIS — M25552 Pain in left hip: Secondary | ICD-10-CM

## 2019-07-11 DIAGNOSIS — M25551 Pain in right hip: Secondary | ICD-10-CM

## 2019-07-11 DIAGNOSIS — M545 Low back pain: Secondary | ICD-10-CM | POA: Diagnosis not present

## 2019-07-11 DIAGNOSIS — M6281 Muscle weakness (generalized): Secondary | ICD-10-CM

## 2019-07-11 NOTE — Telephone Encounter (Signed)
Called and left message on patients VM regarded missed appointment today.  She was reminded of her next appointment and our attendance policy.  Requested she call if unable to attend Wed.

## 2019-07-11 NOTE — Therapy (Signed)
[place], [place], [address] Phone: [phone]   Fax:  [phone]  Physical Therapy Treatment  Patient Details  Name: Leah Baird MRN: [id number] Date of Birth: 1956-08-20 Referring Provider (PT):  Elonda Husky PA-C   Encounter Date: 07/11/2019  PT End of Session - 07/11/19 1306    Visit Number  11    Number of Visits  18    Date for PT Re-Evaluation  08/17/19    Authorization Type  [employer] MRC/MCD, did progress note at 10    Authorization Time Period  pnote 10th visit    PT Start Time  1306    PT Stop Time  1345    PT Time Calculation (min)  39 min    Activity Tolerance  Patient tolerated treatment well    Behavior During Therapy  [facility] for tasks assessed/performed       Past Medical History:  Diagnosis Date  . Depression   . Hypertension   . Mental disorder     Past Surgical History:  Procedure Laterality Date  . ABDOMINAL HYSTERECTOMY     partial  . cyst removed from spine    . TUBAL LIGATION Bilateral     There were no vitals filed for this visit.  Subjective Assessment - 07/11/19 1306    Subjective  "I am having increased pain the R hip and I have been feeling increased soreness which feels like I still have the needle inthere I think the needle is broken off in there. I keep telling them to [place] and x-ray the area to see the needle in there"    Currently in Pain?  Yes    Pain Score  8    last took medication for pain 7 am with no relief   Pain Orientation  Right    Pain Descriptors / Indicators  Aching    Pain Type  Chronic pain    Pain Onset  More than a month ago    Pain Frequency  Constant    Aggravating Factors   activity    Pain Relieving Factors  medication         [facility] PT Assessment - 07/11/19 0001      Assessment   Medical Diagnosis  spondylosis, and SIJ pain    Referring Provider (PT)   Elonda Husky PA-C                   [facility] Adult PT  Treatment/Exercise - 07/11/19 0001      Lumbar Exercises: Stretches   Active Hamstring Stretch  3 reps;30 seconds;Right    Single Knee to Chest Stretch  Right;Left;2 reps;30 seconds    Lower Trunk Rotation  5 reps;10 seconds    Piriformis Stretch  2 reps;30 seconds      Moist Heat Therapy   Number Minutes Moist Heat  15 Minutes    Moist Heat Location  Hip   R in L sidelying     Electrical Stimulation   Electrical Stimulation Location  piriformis    Electrical Stimulation Action  IFC    Electrical Stimulation Parameters  scann 100%, L 16 x 15 min    Electrical Stimulation Goals  Pain      Manual Therapy   Manual Therapy  Joint mobilization;Soft tissue mobilization    Joint Mobilization  LAD grade III    Soft tissue mobilization  IASTM along the glute med/ piriformis  PT Long Term Goals - 07/08/19 1144      PT LONG TERM GOAL #1   Title  I with HEP and return to community exercise class ( 07/12/2019)    Status  On-going      PT LONG TERM GOAL #2   Title  She will report 50% decrease in pain in back and hips with bed mobility and transfers ( 07/12/2019)    Baseline  medication is always on board so it is hard to tell, having an injection this week    Status  On-going      PT LONG TERM GOAL #3   Title  demo bilat hip strength =/> 4+/5 to support ambulation without an assistive device ( 07/12/2019)    Baseline  hip abduction 3/5 to 3+/5    Status  On-going      PT LONG TERM GOAL #4   Title  tolerate core work with no more than 3/10 pain in her back ( 07/12/2019)    Status  On-going      PT LONG TERM GOAL #5   Title  improve FOTO =/< 52% limited ( 07/12/2019)    Time  4    Period  Weeks    Status  On-going            Plan - 07/11/19 1332    Clinical Impression Statement  pt arrived reporting constant pain despite taking medication at 7am, pain is staying between 8-9/10 with no relief frequently stating it feels like something is stabbing her  the region of her piriformis. due to increased pain focused on manual techniques with limited progress/ pain relief. utilized MHP and stim to calm down pain. Discussed at length if she is having no improvement in pain then she needs to get in touch with her MD and set up and appointment, we can continue treatment but due to the severity of pain she would benefit from seeing her provider re-assessment.    PT Treatment/Interventions  Patient/family education;Therapeutic activities;Moist Heat;Ultrasound;Therapeutic exercise;Passive range of motion;Spinal Manipulations;Dry needling;Joint Manipulations;Electrical Stimulation;Neuromuscular re-education;Manual techniques    PT Next Visit Plan  progress core work, modalities PRN, manual work to low back/gluts PRN. Add clam? did she call her MD?    PT Home Exercise Plan  lower trunk rotation, knee to chest, posterior pelvic tilt, bridge, hamstring and piriformis , QL       Patient will benefit from skilled therapeutic intervention in order to improve the following deficits and impairments:  Abnormal gait, Decreased range of motion, Difficulty walking, Obesity, Increased muscle spasms, Pain, Improper body mechanics, Decreased strength  Visit Diagnosis: 1. Pain in left hip   2. Chronic bilateral low back pain without sciatica   3. Pain in right hip   4. Muscle weakness (generalized)   5. Other abnormalities of gait and mobility        Problem List Patient Active Problem List   Diagnosis Date Noted  . Spondylosis without myelopathy or radiculopathy, cervical region 04/13/2019  . Protrusion of lumbar intervertebral disc 04/12/2019  . Right shoulder pain 12/17/2016  . APHTHOUS ULCERS 07/06/2007  . DYSPEPSIA, CHRONIC 07/06/2007  . RECTAL BLEEDING 07/06/2007  . ABSCESS, INNER THIGH 07/06/2007   Lulu RidingKristoffer Dallis Czaja PT, DPT, LAT, ATC  07/11/19  1:38 PM      [facility] [address] [place], [place], [address] Phone: [phone]   Fax:  [phone]  Name: Raynelle HighlandRachel J Aydt MRN: [id number] Date of Birth: May 31, 1956

## 2019-07-14 ENCOUNTER — Other Ambulatory Visit: Payer: Self-pay

## 2019-07-14 ENCOUNTER — Encounter: Payer: Self-pay | Admitting: Physical Therapy

## 2019-07-14 ENCOUNTER — Ambulatory Visit: Payer: Medicare Other | Admitting: Physical Therapy

## 2019-07-14 DIAGNOSIS — M545 Low back pain: Secondary | ICD-10-CM | POA: Diagnosis not present

## 2019-07-14 DIAGNOSIS — M25551 Pain in right hip: Secondary | ICD-10-CM

## 2019-07-14 DIAGNOSIS — M25552 Pain in left hip: Secondary | ICD-10-CM

## 2019-07-14 DIAGNOSIS — M6281 Muscle weakness (generalized): Secondary | ICD-10-CM

## 2019-07-14 DIAGNOSIS — G8929 Other chronic pain: Secondary | ICD-10-CM

## 2019-07-14 DIAGNOSIS — R2689 Other abnormalities of gait and mobility: Secondary | ICD-10-CM

## 2019-07-14 NOTE — Therapy (Signed)
[place], [place], [address] Phone: [phone]   Fax:  [phone]  Physical Therapy Treatment / discharge Summary  Patient Details  Name: Leah Baird MRN: [id number] Date of Birth: 1955-12-19 Referring Provider (PT):  Elonda Husky PA-C   Encounter Date: 07/14/2019  PT End of Session - 07/14/19 1248    Visit Number  12    Number of Visits  18    Date for PT Re-Evaluation  08/17/19    Authorization Type  [employer] MRC/MCD, did progress note at 27    PT Start Time  1247   pt arrived 17 min late   PT Stop Time  1315    PT Time Calculation (min)  28 min    Activity Tolerance  Patient tolerated treatment well    Behavior During Therapy  [facility] for tasks assessed/performed       Past Medical History:  Diagnosis Date  . Depression   . Hypertension   . Mental disorder     Past Surgical History:  Procedure Laterality Date  . ABDOMINAL HYSTERECTOMY     partial  . cyst removed from spine    . TUBAL LIGATION Bilateral     There were no vitals filed for this visit.  Subjective Assessment - 07/14/19 1252    Subjective  " I am still having a lot of pain and it hasn't calmed down since the last session. I was walking and moving find until I got that shot in February. The only thing that helped was the electric thing, but I've been more sore since         [facility] PT Assessment - 07/14/19 0001      Assessment   Medical Diagnosis  spondylosis, and SIJ pain    Referring Provider (PT)   Elonda Husky PA-C      AROM   Overall AROM Comments  limited hip ROM secondary to pain      Strength   Right/Left Hip  --   grossly pain at 3+/5    Right/Left Knee  --   R grossly 3+/5, L 4-/5     Ambulation/Gait   Gait Comments  walking in the parking lot demonstrating limited antalgic gait pattern. in the clinic walking back to the room she demonstrates limited stance on the R, significant antalgic gait pattern with  limited stance on the R and limited step length on the L.                           PT Education - 07/14/19 1323    Education Details  reviewed HEP and benefits of continued strengthening.    Person(s) Educated  Patient    Methods  Explanation;Verbal cues;Handout    Comprehension  Verbalized understanding;Verbal cues required          PT Long Term Goals - 07/14/19 1259      PT LONG TERM GOAL #1   Title  I with HEP and return to community exercise class ( 07/12/2019)    Baseline  reports she doesn't go out much, more at home    Period  Weeks    Status  Partially Met      PT LONG TERM GOAL #2   Title  She will report 50% decrease in pain in back and hips with bed mobility and transfers ( 07/12/2019)    Time  6    Period  Weeks  Status  Not Met      PT LONG TERM GOAL #3   Title  demo bilat hip strength =/> 4+/5 to support ambulation without an assistive device ( 07/12/2019)    Baseline  hip abduction 3/5 to 3+/5    Period  Weeks    Status  Not Met      PT LONG TERM GOAL #4   Title  tolerate core work with no more than 3/10 pain in her back ( 07/12/2019)    Time  6    Period  Weeks    Status  Not Met      PT LONG TERM GOAL #5   Title  improve FOTO =/< 52% limited ( 07/12/2019)    Period  Weeks    Status  Not Met            Plan - 07/14/19 1421    Clinical Impression Statement  pt reports to PT today stating the E-stim last session didn't help and that she has been in constant pain, and her medication is providing her no benefit. She demonstrates limited to no functional progression per her goals and as well as her functional ROM and strength. her FOTO score demonstrates regression compared to previous measures.  most of session was spent reviewing previous HEP and rationale as to why she was being discharged and seh frequently noted how she is in constant pain with no relief and hasn't had any relief in the R hip, and demonstrated multiple positions  that exacerbated pain. following session she noted she understood that she was being discharged and would call her MD to make an appointmnt.    Examination-Activity Limitations  Bed Mobility;Stand;Locomotion Level;Transfers    PT Next Visit Plan  d/C    Consulted and Agree with Plan of Care  Patient       Patient will benefit from skilled therapeutic intervention in order to improve the following deficits and impairments:  Abnormal gait, Decreased range of motion, Difficulty walking, Obesity, Increased muscle spasms, Pain, Improper body mechanics, Decreased strength  Visit Diagnosis: 1. Pain in left hip   2. Other abnormalities of gait and mobility   3. Chronic bilateral low back pain without sciatica   4. Pain in right hip   5. Muscle weakness (generalized)        Problem List Patient Active Problem List   Diagnosis Date Noted  . Spondylosis without myelopathy or radiculopathy, cervical region 04/13/2019  . Protrusion of lumbar intervertebral disc 04/12/2019  . Right shoulder pain 12/17/2016  . APHTHOUS ULCERS 07/06/2007  . DYSPEPSIA, CHRONIC 07/06/2007  . RECTAL BLEEDING 07/06/2007  . ABSCESS, INNER THIGH 07/06/2007    [place] 07/14/2019, 2:25 PM  [facility] [address] [place], [place], [address] Phone: [phone]   Fax:  [phone]  Name: Leah Baird MRN: [id number] Date of Birth: 22-Nov-1956       PHYSICAL THERAPY DISCHARGE SUMMARY  Visits from Start of Care: 12  Current functional level related to goals / functional outcomes: See goals, FOTO 68% limited   Remaining deficits: Constant pain in the R hip/piriformis region with no relief noted with exercise, modalities, or medication. Antalgic gait pattern and limited hip strength secondary to pain.    Education / Equipment: HEP, posture,   Plan: Patient agrees to discharge.  Patient goals were not met. Patient is being discharged due  to lack of progress.  ?????  Kristoffer Leamon PT, DPT, LAT, ATC  07/14/19  2:27 PM

## 2019-07-18 ENCOUNTER — Ambulatory Visit: Payer: Medicare Other | Admitting: Physical Therapy

## 2019-07-21 ENCOUNTER — Ambulatory Visit: Payer: Medicare Other | Admitting: Physical Therapy

## 2019-07-25 ENCOUNTER — Ambulatory Visit: Payer: Medicare Other | Admitting: Physical Therapy

## 2019-07-26 ENCOUNTER — Ambulatory Visit (INDEPENDENT_AMBULATORY_CARE_PROVIDER_SITE_OTHER): Payer: Medicare Other | Admitting: Orthopaedic Surgery

## 2019-07-26 ENCOUNTER — Ambulatory Visit (INDEPENDENT_AMBULATORY_CARE_PROVIDER_SITE_OTHER): Payer: Medicare Other

## 2019-07-26 ENCOUNTER — Other Ambulatory Visit: Payer: Self-pay

## 2019-07-26 VITALS — BP 129/70 | HR 70 | Ht 67.0 in | Wt 242.0 lb

## 2019-07-26 DIAGNOSIS — M25551 Pain in right hip: Secondary | ICD-10-CM

## 2019-07-26 DIAGNOSIS — M545 Low back pain, unspecified: Secondary | ICD-10-CM

## 2019-07-26 DIAGNOSIS — M461 Sacroiliitis, not elsewhere classified: Secondary | ICD-10-CM

## 2019-07-26 DIAGNOSIS — M79604 Pain in right leg: Secondary | ICD-10-CM
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
## 2019-07-27 DIAGNOSIS — M461 Sacroiliitis, not elsewhere classified: Secondary | ICD-10-CM | POA: Insufficient documentation

## 2019-07-28 ENCOUNTER — Encounter: Payer: PRIVATE HEALTH INSURANCE | Admitting: Physical Therapy

## 2019-07-28 ENCOUNTER — Ambulatory Visit: Payer: PRIVATE HEALTH INSURANCE | Admitting: Physical Medicine and Rehabilitation

## 2019-08-01 ENCOUNTER — Telehealth: Payer: Self-pay | Admitting: Radiology

## 2019-08-01 ENCOUNTER — Encounter: Payer: PRIVATE HEALTH INSURANCE | Admitting: Physical Therapy

## 2019-08-01 ENCOUNTER — Telehealth: Payer: Self-pay | Admitting: Orthopaedic Surgery

## 2019-08-01 ENCOUNTER — Other Ambulatory Visit (INDEPENDENT_AMBULATORY_CARE_PROVIDER_SITE_OTHER): Payer: Self-pay | Admitting: Orthopaedic Surgery

## 2019-08-01 NOTE — Telephone Encounter (Signed)
I called left her message arthritis panel thanks

## 2019-08-01 NOTE — Telephone Encounter (Signed)
Tolitha with [facility] called in regards to bloodwork. Patient is an extremely difficult stick and they were only able to get 2 vials of blood when they needed 3. She would like to know what bloodwork is most pertinent to be run at this time, and states they will have to bring patient back to get more for tests that you deem could wait.  Tolitha advised Dr. Lorin Mercy is in surgery this afternoon. If she does not hear back prior to 5pm, she will send off CBC this evening to get it running and will hold other blood for answer tomorrow morning.  Please advise. CB for Bernardo Heater is [phone]

## 2019-08-01 NOTE — Telephone Encounter (Signed)
I spoke with [facility]. Message already in chart for Dr. Lorin Mercy to review.

## 2019-08-01 NOTE — Telephone Encounter (Signed)
Ms. Leah Baird from [facility] left a voicemail message for Dr. Lorin Mercy to call her in regards to this patient.  CB#[phone].  Thank you.

## 2019-08-02 ENCOUNTER — Other Ambulatory Visit (INDEPENDENT_AMBULATORY_CARE_PROVIDER_SITE_OTHER): Payer: Self-pay | Admitting: Orthopaedic Surgery

## 2019-08-02 LAB — CBC
HCT: 42.6 % (ref 35.0–45.0)
Hemoglobin: 14.1 g/dL (ref 11.7–15.5)
MCH: 28.4 pg (ref 27.0–33.0)
MCHC: 33.1 g/dL (ref 32.0–36.0)
MCV: 85.9 fL (ref 80.0–100.0)
MPV: 11 fL (ref 7.5–12.5)
Platelets: 273 10*3/uL (ref 140–400)
RBC: 4.96 10*6/uL (ref 3.80–5.10)
RDW: 12.8 % (ref 11.0–15.0)
WBC: 4.4 10*3/uL (ref 3.8–10.8)

## 2019-08-02 LAB — SEDIMENTATION RATE: Sed Rate: 25 mm/h (ref 0–30)

## 2019-08-02 NOTE — Telephone Encounter (Signed)
noted 

## 2019-08-04 ENCOUNTER — Encounter: Payer: PRIVATE HEALTH INSURANCE | Admitting: Physical Therapy

## 2019-08-04 LAB — RHEUMATOID FACTOR: Rhuematoid fact SerPl-aCnc: <14 IU/mL (ref <14)

## 2019-08-04 LAB — ANA: Anti Nuclear Antibody (ANA): NEGATIVE

## 2019-08-04 LAB — C-REACTIVE PROTEIN: CRP: 7.6 mg/L (ref <8.0)

## 2019-08-04 LAB — URIC ACID: Uric Acid, Serum: 5.9 mg/dL (ref 2.5–7.0)

## 2019-08-06 ENCOUNTER — Ambulatory Visit (HOSPITAL_BASED_OUTPATIENT_CLINIC_OR_DEPARTMENT_OTHER)
Admission: RE | Admit: 2019-08-06 | Discharge: 2019-08-06 | Disposition: A | Discharge status: Home / Self Care | Payer: Medicare Other | Source: Ambulatory Visit | Attending: Orthopaedic Surgery | Admitting: Orthopaedic Surgery

## 2019-08-06 ENCOUNTER — Other Ambulatory Visit: Payer: Self-pay

## 2019-08-06 DIAGNOSIS — M79604 Pain in right leg: Secondary | ICD-10-CM | POA: Diagnosis present

## 2019-08-06 MED ORDER — GADOBUTROL 1 MMOL/ML IV SOLN
10.0000 mL | Freq: Once | INTRAVENOUS | Status: AC | PRN
  Administered 2019-08-06: 14:00:00 10 mL via INTRAVENOUS

## 2019-08-08 ENCOUNTER — Encounter: Payer: PRIVATE HEALTH INSURANCE | Admitting: Physical Therapy

## 2019-08-08 ENCOUNTER — Other Ambulatory Visit: Payer: Self-pay | Admitting: Radiology

## 2019-08-08 DIAGNOSIS — M461 Sacroiliitis, not elsewhere classified: Secondary | ICD-10-CM

## 2019-08-11 ENCOUNTER — Telehealth: Payer: Self-pay | Admitting: Orthopaedic Surgery

## 2019-08-11 ENCOUNTER — Encounter: Payer: PRIVATE HEALTH INSURANCE | Admitting: Physical Therapy

## 2019-08-11 NOTE — Telephone Encounter (Signed)
Please see below.

## 2019-08-11 NOTE — Telephone Encounter (Signed)
Patient called and stated Dr. Lorin Mercy called her and she missed his call. Wants him to call her back. She wants him to know she returned call.

## 2019-08-12 NOTE — Telephone Encounter (Signed)
I did talk with her when I called her again  , plan is aspiration of sacroiliiac joint, right ,by [facility], no injection just aspiration for aerobic, anaerobic, cell count , fungal.    Then rheumatology referral.  Thanks

## 2019-08-12 NOTE — Telephone Encounter (Signed)
Noted. Both referrals have been entered.

## 2019-08-18 ENCOUNTER — Ambulatory Visit (INDEPENDENT_AMBULATORY_CARE_PROVIDER_SITE_OTHER): Payer: Medicare Other | Admitting: Physical Medicine and Rehabilitation

## 2019-08-18 ENCOUNTER — Encounter: Payer: Self-pay | Admitting: Physical Medicine and Rehabilitation

## 2019-08-18 ENCOUNTER — Ambulatory Visit: Payer: Self-pay

## 2019-08-18 DIAGNOSIS — M461 Sacroiliitis, not elsewhere classified: Secondary | ICD-10-CM

## 2019-08-18 NOTE — Progress Notes (Signed)
 .  Numeric Pain Rating Scale and Functional Assessment Average Pain 8   In the last MONTH (on 0-10 scale) has pain interfered with the following?  1. General activity like being  able to carry out your everyday physical activities such as walking, climbing stairs, carrying groceries, or moving a chair?  Rating(8)    -Dye Allergies.  

## 2019-08-18 NOTE — Progress Notes (Signed)
Leah Baird - 63 y.o. female MRN [id number]  Date of birth: July 20, 1956  Office Visit Note: Visit Date: 08/18/2019 PCP: Vicenta Aly, FNP Referred by: Vicenta Aly, FNP  Subjective: No chief complaint on file.  HPI: Leah Baird is a 63 y.o. female who comes in today At the request of Dr. Leana Gamer for diagnostic aspiration of the right sacroiliac joint.  She reports severe 8 out of 10 right low back pain some referral to the thigh on the right this started around February 2020.  She reports any movement makes it worse.  Hot tub baths and medications do take the edge off.  MRI of the sacrum reviewed below and does show unilateral right sacroiliitis with very small joint effusion.  ROS Otherwise per HPI.  Assessment & Plan: Visit Diagnoses:  1. Sacroiliitis ([place])     Plan: Findings:  Very good biplanar imaging showing needle placement into a widened sacroiliac joint with some flow contrast into the posterior part of the joint or inferior part of the joint.  Unfortunately not able to really aspirate any fluid.  If tissue sample or fluid sample really needs to be collected I would suggest CT-guided aspiration/biopsy.    Meds & Orders: No orders of the defined types were placed in this encounter.   Orders Placed This Encounter  Procedures  . Sacroiliac Joint Inj  . XR C-ARM NO REPORT    Follow-up: Return for Rodell Perna, MD.   Procedures: Sacroiliac Joint Inj on 08/18/2019 10:14 AM Indications: pain and diagnostic evaluation Details: (20 Guage) 3.5 in needle, fluoroscopy-guided posterior approach Aspirate: 0 mL Outcome: tolerated well, no immediate complications  There was good flow of contrast producing a partial arthrogram of the lower sacroiliac joint. Sacroiliac joint appears widened. No aspirate achieved. Washout with sterile saline, no recovery. Procedure, treatment alternatives, risks and benefits explained, specific risks discussed. Consent was given by the  patient. Immediately prior to procedure a time out was called to verify the correct patient, procedure, equipment, support staff and site/side marked as required. Patient was prepped and draped in the usual sterile fashion.      No notes on file   Clinical History: MRI SACRUM WITHOUT AND WITH CONTRAST  TECHNIQUE: Multiplanar and multiecho pulse sequences of the SACRUM were obtained without and with intravenous contrast.  CONTRAST:  10 mL Gadavist  COMPARISON:  MRI lumbar spine 12/01/2015  FINDINGS: Bones/Joint/Cartilage  No SI joint widening or erosive changes. Severe subchondral marrow edema on either side of the right sacroiliac joint with a small amount of joint fluid. No definite erosive changes. No periarticular soft tissue edema.  Left SI joint is normal.  No fracture or dislocation. Normal alignment. No periosteal reaction or bone destruction.  Ligaments, Muscles and Tendons Muscles are normal. No muscle atrophy. No intramuscular fluid collection or hematoma. Muscles enhance normally and homogeneously.  Soft tissue No fluid collection or hematoma. No soft tissue mass. Prior hysterectomy.  IMPRESSION: 1. Unilateral right sacroiliitis with severe subchondral marrow edema. Differential considerations include unilateral spondyloarthropathy, infection due to typical and atypical etiologies, versus SAPHO syndrome.   Electronically Signed   By: Kathreen Devoid   On: 08/07/2019 09:31   She reports that she has quit smoking. Her smoking use included cigarettes. She has a 1.00 pack-year smoking history. She has never used smokeless tobacco.  Recent Labs    08/02/19 0901  LABURIC 5.9    Objective:  VS:  HT:    WT:   BMI:  BP:   HR: bpm  TEMP: ( )  RESP:  Physical Exam  Ortho Exam Imaging: No results found.  Past Medical/Family/Surgical/Social History: Medications & Allergies reviewed per EMR, new medications updated. Patient Active Problem  List   Diagnosis Date Noted  . Sacroiliitis, not elsewhere classified (HCC) 07/27/2019  . Spondylosis without myelopathy or radiculopathy, cervical region 04/13/2019  . Protrusion of lumbar intervertebral disc 04/12/2019  . Right shoulder pain 12/17/2016  . APHTHOUS ULCERS 07/06/2007  . DYSPEPSIA, CHRONIC 07/06/2007  . RECTAL BLEEDING 07/06/2007  . ABSCESS, INNER THIGH 07/06/2007   Past Medical History:  Diagnosis Date  . Depression   . Hypertension   . Mental disorder    Family History  Problem Relation Age of Onset  . Breast cancer Mother   . Breast cancer Sister    Past Surgical History:  Procedure Laterality Date  . ABDOMINAL HYSTERECTOMY     partial  . cyst removed from spine    . TUBAL LIGATION Bilateral    Social History   Occupational History  . Not on file  Tobacco Use  . Smoking status: Former Smoker    Packs/day: 0.10    Years: 10.00    Pack years: 1.00    Types: Cigarettes  . Smokeless tobacco: Never Used  Substance and Sexual Activity  . Alcohol use: No  . Drug use: No  . Sexual activity: Never

## 2019-08-30 ENCOUNTER — Telehealth: Payer: Self-pay | Admitting: Orthopaedic Surgery

## 2019-08-30 NOTE — Telephone Encounter (Signed)
Pt called in requesting a call back from dr.yates to get the results from the procedure that dr.yates had dr.newton do .    [phone]

## 2019-08-31 NOTE — Telephone Encounter (Signed)
Have her see a Rheumatologist thanks. I will call her now.
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
## 2019-09-01 NOTE — Telephone Encounter (Signed)
Patient already has appt scheduled with Gavin Pound on 09/16/2019 from referral entered in August.

## 2020-01-20 ENCOUNTER — Other Ambulatory Visit: Payer: Self-pay

## 2020-01-20 ENCOUNTER — Ambulatory Visit (HOSPITAL_COMMUNITY)
Admission: EM | Admit: 2020-01-20 | Discharge: 2020-01-20 | Disposition: A | Discharge status: Home / Self Care | Payer: Medicare Other | Attending: Family Medicine | Admitting: Family Medicine

## 2020-01-20 ENCOUNTER — Encounter (HOSPITAL_COMMUNITY): Payer: Self-pay

## 2020-01-20 DIAGNOSIS — K1379 Other lesions of oral mucosa: Secondary | ICD-10-CM | POA: Diagnosis present

## 2020-01-20 DIAGNOSIS — H9203 Otalgia, bilateral: Secondary | ICD-10-CM

## 2020-01-20 LAB — POCT RAPID STREP A: Streptococcus, Group A Screen (Direct): NEGATIVE

## 2020-01-20 MED ORDER — TRIAMCINOLONE ACETONIDE 55 MCG/ACT NA AERO: 2.0000 | INHALATION_SPRAY | Freq: Every day | NASAL | 0 refills | Status: AC

## 2020-01-20 MED ORDER — MAGIC MOUTHWASH W/LIDOCAINE: 15.0000 mL | Freq: Four times a day (QID) | ORAL | 0 refills | Status: DC | PRN

## 2020-01-20 MED ORDER — AMOXICILLIN-POT CLAVULANATE 875-125 MG PO TABS: 1.0000 | ORAL_TABLET | Freq: Two times a day (BID) | ORAL | 0 refills | Status: AC

## 2020-01-20 NOTE — Discharge Instructions (Addendum)
Complete course of antibiotics for your ears.  Daily nasal spray as I have prescribed, likely will help your ears.  Use of the mouthwash solution three times a day as needed, you do have some ulcers to the back of your mouth which are likely causing discomfort.  Continue to follow with ENT as needed for persistent concerns.

## 2020-01-20 NOTE — ED Triage Notes (Signed)
Pt is here with a sore throat and ear pain in both ears, states she does NOT want COVID testing & that she has this issues every year. Pt can elaborate further.

## 2020-01-20 NOTE — ED Provider Notes (Signed)
[facility]    CSN: [id number] Arrival date & time: 01/20/20  2841      History   Chief Complaint Chief Complaint  Patient presents with  . Sore Throat  . Otalgia    HPI Leah Baird is a 64 y.o. female.   Leah Baird presents with complaints of ear pain and throat. Has had issues intermittently ever since her tonsils removed years ago. Symptoms started a few weeks ago. Has been taking BC, cough medication and ibuprofen. Has followed with ENT in the past, has been using atrovent nasal spray. Occasional headache. Fever last night, she wasn't able to check it with a thermometer. Didn't take any tylenol or ibuprofen this morning. Occasional nasal drainage. No cough, chest pain  Or shortness of breath.  No known ill contacts. No gi symptoms. No known upcoming appointment with ENT. Patient states that this has ultimately been a chronic issue for her.   ROS per HPI, negative if not otherwise mentioned.      Past Medical History:  Diagnosis Date  . Depression   . Hypertension   . Mental disorder     Patient Active Problem List   Diagnosis Date Noted  . Sacroiliitis, not elsewhere classified (HCC) 07/27/2019  . Spondylosis without myelopathy or radiculopathy, cervical region 04/13/2019  . Protrusion of lumbar intervertebral disc 04/12/2019  . Right shoulder pain 12/17/2016  . APHTHOUS ULCERS 07/06/2007  . DYSPEPSIA, CHRONIC 07/06/2007  . RECTAL BLEEDING 07/06/2007  . ABSCESS, INNER THIGH 07/06/2007    Past Surgical History:  Procedure Laterality Date  . ABDOMINAL HYSTERECTOMY     partial  . cyst removed from spine    . TUBAL LIGATION Bilateral     OB History    Gravida  2   Para      Term      Preterm      AB      Living  2     SAB      TAB      Ectopic      Multiple      Live Births               Home Medications    Prior to Admission medications   Medication Sig Start Date End Date Taking? Authorizing Provider   baclofen (LIORESAL) 10 MG tablet Take by mouth. 06/16/19  Yes [provider]  diclofenac (VOLTAREN) 75 MG EC tablet TAKE 1 TABLET BY MOUTH 2 TIMES DAILY 06/16/19  Yes [provider]  gabapentin (NEURONTIN) 300 MG capsule Take by mouth. 06/16/19  Yes [provider]  hydrochlorothiazide (HYDRODIURIL) 25 MG tablet Take by mouth. 10/12/19  Yes [provider]  phentermine 15 MG capsule Take by mouth. 07/27/19  Yes [provider]  simvastatin (ZOCOR) 20 MG tablet TAKE ONE TABLET BY MOUTH EVERY NIGHT AT BEDTIME 10/12/19  Yes [provider]  acetaminophen-codeine (TYLENOL #3) 300-30 MG tablet Take 1 tablet by mouth every 8 (eight) hours as needed for moderate pain. 05/24/19   Eldred Manges, MD  amoxicillin-clavulanate (AUGMENTIN) 875-125 MG tablet Take 1 tablet by mouth every 12 (twelve) hours for 5 days. 01/20/20 01/25/20  Linus Mako B, NP  ascorbic acid (VITAMIN C) 500 MG tablet Take by mouth.    [provider]  aspirin-acetaminophen-caffeine (EXCEDRIN MIGRAINE) [phone] MG tablet Take by mouth.    [provider]  baclofen (LIORESAL) 10 MG tablet Take by mouth.    [provider]  benzonatate (TESSALON) 100 MG capsule Take 1 capsule (100 mg total) by mouth 3 (three) times daily as needed for cough. 01/28/14   Presson, Annett Gula H, PA  BIOTIN PO Take by mouth.    [provider]  diclofenac (VOLTAREN) 75 MG EC tablet  03/04/19   [provider]  gabapentin (NEURONTIN) 300 MG capsule Take by mouth. 05/09/15   [provider]  hydrochlorothiazide (HYDRODIURIL) 25 MG tablet Take 25 mg by mouth daily.    [provider]  HYDROcodone-acetaminophen (NORCO/VICODIN) 5-325 MG tablet Take 2 tablets by mouth every 4 (four) hours as needed. 07/11/16   Long, Wonda Olds, MD  ibuprofen (ADVIL,MOTRIN) 200 MG tablet Take 400 mg by mouth every 6 (six) hours as needed. For pain     [provider]   ipratropium (ATROVENT) 0.06 % nasal spray Place 2 sprays into both nostrils 4 (four) times daily. 01/28/14   Presson, Audelia Hives, PA  loratadine (CLARITIN) 10 MG tablet Take 1 tablet (10 mg total) by mouth daily. 01/28/14   Presson, Audelia Hives, PA  magic mouthwash SOLN Take 5 mLs by mouth 3 (three) times daily as needed for mouth pain. 07/11/16   Long, Wonda Olds, MD  magic mouthwash w/lidocaine SOLN Take 15 mLs by mouth 4 (four) times daily as needed for mouth pain. Hold in mouth for 1-2 minutes then spit out 01/20/20   Augusto Gamble B, NP  meloxicam (MOBIC) 15 MG tablet Take 1 tablet (15 mg total) by mouth daily. 02/01/13   Margarita Mail, PA-C  Multiple Vitamins-Minerals (MULTIPLE VITAMINS/WOMENS) tablet Take 1 tablet by mouth daily.    [provider]  Multiple Vitamins-Minerals (VITACEL) TABS Take 1 tablet by mouth daily.    [provider]  naproxen sodium (ANAPROX) 220 MG tablet Take 220 mg by mouth 2 (two) times daily with a meal.    [provider]  simvastatin (ZOCOR) 10 MG tablet Take 10 mg by mouth.    [provider]  topiramate (TOPAMAX) 50 MG tablet Take 1 tablet (50 mg total) by mouth 2 (two) times daily. 04/07/17   Garvin Fila, MD  triamcinolone (NASACORT) 55 MCG/ACT AERO nasal inhaler Place 2 sprays into the nose daily. 01/20/20   Zigmund Gottron, NP  VITAMIN A PO Take by mouth.    [provider]  vitamin B-12 (CYANOCOBALAMIN) 100 MCG tablet Take by mouth.    [provider]  VITAMIN D, CHOLECALCIFEROL, PO Take by mouth.    [provider]  VITAMIN E PO Take by mouth.    [provider]    Family History Family History  Problem Relation Age of Onset  . Healthy Father   . Breast cancer Mother   . Breast cancer Sister     Social History Social History   Tobacco Use  . Smoking status: Former Smoker    Packs/day: 0.10    Years: 10.00    Pack years: 1.00    Types: Cigarettes  . Smokeless  tobacco: Never Used  Substance Use Topics  . Alcohol use: No  . Drug use: No     Allergies   Sulfonamide derivatives and Tramadol hcl   Review of Systems Review of Systems   Physical Exam Triage Vital Signs ED Triage Vitals  Enc Vitals Group     BP 01/20/20 0918 (!) 122/50     Pulse Rate 01/20/20 0918 63     Resp 01/20/20 0918 18  Temp 01/20/20 0918 98.7 F (37.1 C)     Temp Source 01/20/20 0918 Oral     SpO2 01/20/20 0918 99 %     Weight 01/20/20 0912 238 lb 12.8 oz (108.3 kg)     Height --      Head Circumference --      Peak Flow --      Pain Score 01/20/20 0911 7     Pain Loc --      Pain Edu? --      Excl. in GC? --    No data found.  Updated Vital Signs BP (!) 122/50 (BP Location: Left Arm)   Pulse 63   Temp 98.7 F (37.1 C) (Oral)   Resp 18   Wt 238 lb 12.8 oz (108.3 kg)   SpO2 99%   BMI 37.40 kg/m    Physical Exam Constitutional:      General: She is not in acute distress.    Appearance: She is well-developed.  HENT:     Right Ear: Tympanic membrane and ear canal normal. No drainage or swelling.     Left Ear: No drainage, swelling or tenderness. Tympanic membrane is erythematous.     Nose: No congestion.     Mouth/Throat:     Tonsils: 0 on the right. 0 on the left.     Comments: Oral lesions/ ulcerations to soft palate noted; upper dentures removed without sores underlying these Cardiovascular:     Rate and Rhythm: Normal rate.  Pulmonary:     Effort: Pulmonary effort is normal.  Skin:    General: Skin is warm and dry.  Neurological:     Mental Status: She is alert and oriented to person, place, and time.      UC Treatments / Results  Labs (all labs ordered are listed, but only abnormal results are displayed) Labs Reviewed  CULTURE, GROUP A STREP [facility])  POCT RAPID STREP A    EKG   Radiology No results found.  Procedures Procedures (including critical care time)  Medications Ordered in UC Medications - No data to  display  Initial Impression / Assessment and Plan / UC Course  I have reviewed the triage vital signs and the nursing notes.  Pertinent labs & imaging results that were available during my care of the patient were reviewed by me and considered in my medical decision making (see chart for details).     Acute on chronic ear and throat pain. Ulcerations to mouth noted, magic mouthwash provided. There is redness to left TM noted, augmentin initiated. Intranasal steroid recommended. Continue to follow with ENT. Patient verbalized understanding and agreeable to plan.   Final Clinical Impressions(s) / UC Diagnoses   Final diagnoses:  Other lesions of oral mucosa  Otalgia of both ears     Discharge Instructions     Complete course of antibiotics for your ears.  Daily nasal spray as I have prescribed, likely will help your ears.  Use of the mouthwash solution three times a day as needed, you do have some ulcers to the back of your mouth which are likely causing discomfort.  Continue to follow with ENT as needed for persistent concerns.     ED Prescriptions    Medication Sig Dispense Auth. Provider   triamcinolone (NASACORT) 55 MCG/ACT AERO nasal inhaler Place 2 sprays into the nose daily. 1 Inhaler Linus Mako B, NP   amoxicillin-clavulanate (AUGMENTIN) 875-125 MG tablet Take 1 tablet by mouth every 12 (twelve) hours for  5 days. 10 tablet Linus Mako B, NP   magic mouthwash w/lidocaine SOLN Take 15 mLs by mouth 4 (four) times daily as needed for mouth pain. Hold in mouth for 1-2 minutes then spit out 320 mL Linus Mako B, NP     PDMP not reviewed this encounter.   Georgetta Haber, NP 01/20/20 1030

## 2020-01-24 LAB — CULTURE, GROUP A STREP (THRC)

## 2020-02-07 ENCOUNTER — Other Ambulatory Visit: Payer: Self-pay

## 2020-02-07 ENCOUNTER — Encounter (HOSPITAL_COMMUNITY): Payer: Self-pay

## 2020-02-07 ENCOUNTER — Ambulatory Visit (HOSPITAL_COMMUNITY)
Admission: EM | Admit: 2020-02-07 | Discharge: 2020-02-07 | Disposition: A | Discharge status: Home / Self Care | Payer: Medicare Other | Attending: Family Medicine | Admitting: Family Medicine

## 2020-02-07 DIAGNOSIS — K1379 Other lesions of oral mucosa: Secondary | ICD-10-CM | POA: Diagnosis not present

## 2020-02-07 MED ORDER — LIDOCAINE VISCOUS HCL 2 % MT SOLN: 15.0000 mL | OROMUCOSAL | 0 refills | Status: DC | PRN

## 2020-02-07 NOTE — ED Provider Notes (Signed)
[facility]    CSN: [id number] Arrival date & time: 02/07/20  4098      History   Chief Complaint Chief Complaint  Patient presents with  . Thrush    HPI Leah Baird is a 64 y.o. female.   Patient is a 64 year old female past medical history depression, hypertension, mental disorder.  She presents today with oral sores and irritation.  This has been ongoing issue for the past couple weeks.  Was seen here on 01/20/2020 and treated with antibiotics for ear infection and prescribed Magic mouthwash for the ulcers in her mouth.  She was unable to pick this up or start this due to the medication being too expensive.  Admits that her dentures do not fit well and slide around and hurt her the inside of her mouth.  Denies any injuries to the mouth, fever, chills.  Denies any sore throat or trouble swallowing or breathing.  Denies any ear pain.   ROS per HPI      Past Medical History:  Diagnosis Date  . Depression   . Hypertension   . Mental disorder     Patient Active Problem List   Diagnosis Date Noted  . Sacroiliitis, not elsewhere classified (HCC) 07/27/2019  . Spondylosis without myelopathy or radiculopathy, cervical region 04/13/2019  . Protrusion of lumbar intervertebral disc 04/12/2019  . Right shoulder pain 12/17/2016  . APHTHOUS ULCERS 07/06/2007  . DYSPEPSIA, CHRONIC 07/06/2007  . RECTAL BLEEDING 07/06/2007  . ABSCESS, INNER THIGH 07/06/2007    Past Surgical History:  Procedure Laterality Date  . ABDOMINAL HYSTERECTOMY     partial  . cyst removed from spine    . TUBAL LIGATION Bilateral     OB History    Gravida  2   Para      Term      Preterm      AB      Living  2     SAB      TAB      Ectopic      Multiple      Live Births               Home Medications    Prior to Admission medications   Medication Sig Start Date End Date Taking? Authorizing Provider  acetaminophen-codeine (TYLENOL #3) 300-30 MG tablet Take 1  tablet by mouth every 8 (eight) hours as needed for moderate pain. 05/24/19   Eldred Manges, MD  ascorbic acid (VITAMIN C) 500 MG tablet Take by mouth.    [provider]  aspirin-acetaminophen-caffeine (EXCEDRIN MIGRAINE) [phone] MG tablet Take by mouth.    [provider]  baclofen (LIORESAL) 10 MG tablet Take by mouth.    [provider]  baclofen (LIORESAL) 10 MG tablet Take by mouth. 06/16/19   [provider]  benzonatate (TESSALON) 100 MG capsule Take 1 capsule (100 mg total) by mouth 3 (three) times daily as needed for cough. 01/28/14   Presson, Jess Barters H, PA  BIOTIN PO Take by mouth.    [provider]  diclofenac (VOLTAREN) 75 MG EC tablet  03/04/19   [provider]  diclofenac (VOLTAREN) 75 MG EC tablet TAKE 1 TABLET BY MOUTH 2 TIMES DAILY 06/16/19   [provider]  gabapentin (NEURONTIN) 300 MG capsule Take by mouth. 05/09/15   [provider]  gabapentin (NEURONTIN) 300 MG capsule Take by mouth. 06/16/19   [provider]  hydrochlorothiazide (HYDRODIURIL) 25 MG  tablet Take 25 mg by mouth daily.    [provider]  hydrochlorothiazide (HYDRODIURIL) 25 MG tablet Take by mouth. 10/12/19   [provider]  HYDROcodone-acetaminophen (NORCO/VICODIN) 5-325 MG tablet Take 2 tablets by mouth every 4 (four) hours as needed. 07/11/16   Long, Arlyss Repress, MD  ibuprofen (ADVIL,MOTRIN) 200 MG tablet Take 400 mg by mouth every 6 (six) hours as needed. For pain     [provider]  ipratropium (ATROVENT) 0.06 % nasal spray Place 2 sprays into both nostrils 4 (four) times daily. 01/28/14   Presson, Mathis Fare, PA  lidocaine (XYLOCAINE) 2 % solution Use as directed 15 mLs in the mouth or throat as needed for mouth pain. 02/07/20   Dahlia Byes A, NP  loratadine (CLARITIN) 10 MG tablet Take 1 tablet (10 mg total) by mouth daily. 01/28/14   Presson, Mathis Fare, PA  magic mouthwash SOLN Take 5 mLs by  mouth 3 (three) times daily as needed for mouth pain. 07/11/16   Long, Arlyss Repress, MD  magic mouthwash w/lidocaine SOLN Take 15 mLs by mouth 4 (four) times daily as needed for mouth pain. Hold in mouth for 1-2 minutes then spit out 01/20/20   Linus Mako B, NP  meloxicam (MOBIC) 15 MG tablet Take 1 tablet (15 mg total) by mouth daily. 02/01/13   Arthor Captain, PA-C  Multiple Vitamins-Minerals (MULTIPLE VITAMINS/WOMENS) tablet Take 1 tablet by mouth daily.    [provider]  Multiple Vitamins-Minerals (VITACEL) TABS Take 1 tablet by mouth daily.    [provider]  naproxen sodium (ANAPROX) 220 MG tablet Take 220 mg by mouth 2 (two) times daily with a meal.    [provider]  phentermine 15 MG capsule Take by mouth. 07/27/19   [provider]  simvastatin (ZOCOR) 10 MG tablet Take 10 mg by mouth.    [provider]  simvastatin (ZOCOR) 20 MG tablet TAKE ONE TABLET BY MOUTH EVERY NIGHT AT BEDTIME 10/12/19   [provider]  topiramate (TOPAMAX) 50 MG tablet Take 1 tablet (50 mg total) by mouth 2 (two) times daily. 04/07/17   Micki Riley, MD  triamcinolone (NASACORT) 55 MCG/ACT AERO nasal inhaler Place 2 sprays into the nose daily. 01/20/20   Georgetta Haber, NP  VITAMIN A PO Take by mouth.    [provider]  vitamin B-12 (CYANOCOBALAMIN) 100 MCG tablet Take by mouth.    [provider]  VITAMIN D, CHOLECALCIFEROL, PO Take by mouth.    [provider]  VITAMIN E PO Take by mouth.    [provider]    Family History Family History  Problem Relation Age of Onset  . Healthy Father   . Breast cancer Mother   . Breast cancer Sister     Social History Social History   Tobacco Use  . Smoking status: Former Smoker    Packs/day: 0.10    Years: 10.00    Pack years: 1.00    Types: Cigarettes  . Smokeless tobacco: Never Used  Substance Use Topics  . Alcohol use: No  . Drug use: No     Allergies    Sulfonamide derivatives and Tramadol hcl   Review of Systems Review of Systems   Physical Exam Triage Vital Signs ED Triage Vitals  Enc Vitals Group     BP 02/07/20 0929 136/64     Pulse Rate 02/07/20 0929 60     Resp 02/07/20 0929 18  Temp 02/07/20 0929 98.3 F (36.8 C)     Temp Source 02/07/20 0929 Oral     SpO2 02/07/20 0929 96 %     Weight 02/07/20 0926 235 lb 9.6 oz (106.9 kg)     Height --      Head Circumference --      Peak Flow --      Pain Score 02/07/20 0925 8     Pain Loc --      Pain Edu? --      Excl. in GC? --    No data found.  Updated Vital Signs BP 136/64 (BP Location: Right Arm)   Pulse 60   Temp 98.3 F (36.8 C) (Oral)   Resp 18   Wt 235 lb 9.6 oz (106.9 kg)   SpO2 96%   BMI 36.90 kg/m   Visual Acuity Right Eye Distance:   Left Eye Distance:   Bilateral Distance:    Right Eye Near:   Left Eye Near:    Bilateral Near:     Physical Exam Vitals and nursing note reviewed.  Constitutional:      General: She is not in acute distress.    Appearance: Normal appearance. She is not ill-appearing, toxic-appearing or diaphoretic.  HENT:     Head: Normocephalic.     Nose: Nose normal.     Mouth/Throat:     Comments: Small ulcerations noted to right buccal mucosa and some noted to soft palate.  Mild right facial swelling No tonsillar swelling or erythema. Eyes:     Conjunctiva/sclera: Conjunctivae normal.  Pulmonary:     Effort: Pulmonary effort is normal.  Musculoskeletal:        General: Normal range of motion.     Cervical back: Normal range of motion.  Skin:    General: Skin is warm and dry.     Findings: No rash.  Neurological:     Mental Status: She is alert.  Psychiatric:        Mood and Affect: Mood normal.      UC Treatments / Results  Labs (all labs ordered are listed, but only abnormal results are displayed) Labs Reviewed  HSV CULTURE AND TYPING    EKG   Radiology No results found.  Procedures Procedures  (including critical care time)  Medications Ordered in UC Medications - No data to display  Initial Impression / Assessment and Plan / UC Course  I have reviewed the triage vital signs and the nursing notes.  Pertinent labs & imaging results that were available during my care of the patient were reviewed by me and considered in my medical decision making (see chart for details).     Oral pain-most likely source of pain is due to her dentures not fitting well.  Believe they  may be rubbing the right side of her mouth causing ulcers.  No signs of infection today. Sending for HSV based on the way the sores appear. Not likely diagnosis but want to rule this out. Giving lidocaine to swish and spit for pain Recommended follow-up with dentist for refitting of dentures. Pt understanding and agreed.  Final Clinical Impressions(s) / UC Diagnoses   Final diagnoses:  Oral pain     Discharge Instructions     The sores in your mouth and pain is most likely due to your dentures not fitting well.  I would recommend following up with the dentist.  Lidocaine for pain You can swish ans spit this a few times a  day as needed.      ED Prescriptions    Medication Sig Dispense Auth. Provider   lidocaine (XYLOCAINE) 2 % solution Use as directed 15 mLs in the mouth or throat as needed for mouth pain. 100 mL Loura Halt A, NP     PDMP not reviewed this encounter.   Loura Halt A, NP 02/07/20 1404

## 2020-02-07 NOTE — ED Triage Notes (Signed)
Pt is here with oral thrush that started 2 days ago. Facial pain is the result of this pain, pt states she has taken multi-sinus relief meds.

## 2020-02-07 NOTE — Discharge Instructions (Signed)
The sores in your mouth and pain is most likely due to your dentures not fitting well.  I would recommend following up with the dentist.  Lidocaine for pain You can swish ans spit this a few times a day as needed.

## 2020-02-09 LAB — HSV CULTURE AND TYPING

## 2020-04-13 DIAGNOSIS — E538 Deficiency of other specified B group vitamins: Secondary | ICD-10-CM | POA: Insufficient documentation

## 2020-04-13 DIAGNOSIS — E559 Vitamin D deficiency, unspecified: Secondary | ICD-10-CM | POA: Insufficient documentation

## 2020-05-10 ENCOUNTER — Encounter (HOSPITAL_COMMUNITY): Payer: Self-pay | Admitting: Emergency Medicine

## 2020-05-10 ENCOUNTER — Other Ambulatory Visit: Payer: Self-pay

## 2020-05-10 ENCOUNTER — Ambulatory Visit (HOSPITAL_COMMUNITY)
Admission: EM | Admit: 2020-05-10 | Discharge: 2020-05-10 | Disposition: A | Discharge status: Home / Self Care | Payer: Medicare Other | Attending: Physician Assistant | Admitting: Physician Assistant

## 2020-05-10 DIAGNOSIS — J029 Acute pharyngitis, unspecified: Secondary | ICD-10-CM | POA: Insufficient documentation

## 2020-05-10 DIAGNOSIS — Z885 Allergy status to narcotic agent status: Secondary | ICD-10-CM | POA: Insufficient documentation

## 2020-05-10 DIAGNOSIS — Z882 Allergy status to sulfonamides status: Secondary | ICD-10-CM | POA: Diagnosis not present

## 2020-05-10 DIAGNOSIS — I10 Essential (primary) hypertension: Secondary | ICD-10-CM | POA: Insufficient documentation

## 2020-05-10 DIAGNOSIS — H6522 Chronic serous otitis media, left ear: Secondary | ICD-10-CM | POA: Diagnosis not present

## 2020-05-10 DIAGNOSIS — Z87891 Personal history of nicotine dependence: Secondary | ICD-10-CM | POA: Diagnosis not present

## 2020-05-10 DIAGNOSIS — H9202 Otalgia, left ear: Secondary | ICD-10-CM

## 2020-05-10 DIAGNOSIS — F329 Major depressive disorder, single episode, unspecified: Secondary | ICD-10-CM | POA: Diagnosis not present

## 2020-05-10 DIAGNOSIS — Z20822 Contact with and (suspected) exposure to covid-19: Secondary | ICD-10-CM | POA: Diagnosis not present

## 2020-05-10 DIAGNOSIS — Z803 Family history of malignant neoplasm of breast: Secondary | ICD-10-CM | POA: Insufficient documentation

## 2020-05-10 LAB — POCT RAPID STREP A: Streptococcus, Group A Screen (Direct): NEGATIVE

## 2020-05-10 MED ORDER — MAGIC MOUTHWASH W/LIDOCAINE: 15.0000 mL | Freq: Four times a day (QID) | ORAL | 0 refills | Status: DC | PRN

## 2020-05-10 MED ORDER — FLUTICASONE PROPIONATE 50 MCG/ACT NA SUSP: 1.0000 | Freq: Every day | NASAL | 0 refills | Status: AC

## 2020-05-10 NOTE — Discharge Instructions (Signed)
I want you to start using Flonase daily Continue your allergy medicine Take 2 regular strength Tylenol every 6-8 hours  I want you to use the Magic mouthwash, swish and gargle with this up to 4 times a day but do not swallow.  Ensure you spit out the contents.  Schedule follow-up with your primary care for next week for reevaluation  If significantly worsening or not improving over the next 3 days such that you develop fever or worsening ability to swallow please return.  Your rapid strep was negative and we sent a throat culture will notify of any necessary changes to your treatment  If your Covid-19 test is positive, you will receive a phone call from [facility] regarding your results. Negative test results are not called. Both positive and negative results area always visible on MyChart. If you do not have a MyChart account, sign up instructions are in your discharge papers.   Persons who are directed to care for themselves at home may discontinue isolation under the following conditions:   At least 10 days have passed since symptom onset and  At least 24 hours have passed without running a fever (this means without the use of fever-reducing medications) and  Other symptoms have improved.  Persons infected with COVID-19 who never develop symptoms may discontinue isolation and other precautions 10 days after the date of their first positive COVID-19 test.

## 2020-05-10 NOTE — ED Triage Notes (Signed)
PT reports left ear pain and swelling and a sore throat on same side. This started 2 days ago. She has tried allergy meds without relief.

## 2020-05-10 NOTE — ED Provider Notes (Signed)
[place]    CSN: [id number] Arrival date & time: 05/10/20  4259      History   Chief Complaint Chief Complaint  Patient presents with  . Otalgia  . Sore Throat    HPI Leah Baird is a 64 y.o. female.   Patient presents for recurrent throat and ear pain.  She reports 2 to 3 days ago she began having a sore throat and left ear pain.  She reports pain is in the back of her throat and radiates into her ear and down her neck.  She reports it is worse with swallowing.  She reports it hurts when she moves her tongue.  She denies difficulty swallowing only pain with swallowing.  She has been drinking liquids and eating.  She denies fever or chills.  Denies recent cold-like symptoms.  She does endorse history of allergies and is used medicines intermittently.  She denies hearing loss or ear fullness just pain.  No pain with movement of the ear reported.  She does report she cleans her ears occasionally.  She reports she has been seen by primary care, this clinic in ENT for similar issues and this has been going on for at least a year.  She does report some improvement previously with mouthwashes with regard to throat pain.  She reports she received antibiotics in the past and this seemed to not help much.     Past Medical History:  Diagnosis Date  . Depression   . Hypertension   . Mental disorder     Patient Active Problem List   Diagnosis Date Noted  . Sacroiliitis, not elsewhere classified ([place]) 07/27/2019  . Spondylosis without myelopathy or radiculopathy, cervical region 04/13/2019  . Protrusion of lumbar intervertebral disc 04/12/2019  . Right shoulder pain 12/17/2016  . APHTHOUS ULCERS 07/06/2007  . DYSPEPSIA, CHRONIC 07/06/2007  . RECTAL BLEEDING 07/06/2007  . ABSCESS, INNER THIGH 07/06/2007    Past Surgical History:  Procedure Laterality Date  . ABDOMINAL HYSTERECTOMY     partial  . cyst removed from spine    . TUBAL LIGATION Bilateral     OB  History    Gravida  2   Para      Term      Preterm      AB      Living  2     SAB      TAB      Ectopic      Multiple      Live Births               Home Medications    Prior to Admission medications   Medication Sig Start Date End Date Taking? Authorizing Provider  baclofen (LIORESAL) 10 MG tablet Take by mouth.   Yes [provider]  BIOTIN PO Take by mouth.   Yes [provider]  diclofenac (VOLTAREN) 75 MG EC tablet  03/04/19  Yes [provider]  gabapentin (NEURONTIN) 300 MG capsule Take by mouth. 05/09/15  Yes [provider]  hydrochlorothiazide (HYDRODIURIL) 25 MG tablet Take 25 mg by mouth daily.   Yes [provider]  Multiple Vitamins-Minerals (MULTIPLE VITAMINS/WOMENS) tablet Take 1 tablet by mouth daily.   Yes [provider]  simvastatin (ZOCOR) 10 MG tablet Take 10 mg by mouth.   Yes [provider]  acetaminophen-codeine (TYLENOL #3) 300-30 MG tablet Take 1 tablet by mouth every 8 (eight) hours as needed for moderate pain. 05/24/19  Eldred Manges, MD  ascorbic acid (VITAMIN C) 500 MG tablet Take by mouth.    [provider]  aspirin-acetaminophen-caffeine (EXCEDRIN MIGRAINE) [phone] MG tablet Take by mouth.    [provider]  baclofen (LIORESAL) 10 MG tablet Take by mouth. 06/16/19   [provider]  benzonatate (TESSALON) 100 MG capsule Take 1 capsule (100 mg total) by mouth 3 (three) times daily as needed for cough. 01/28/14   Presson, Mathis Fare, PA  diclofenac (VOLTAREN) 75 MG EC tablet TAKE 1 TABLET BY MOUTH 2 TIMES DAILY 06/16/19   [provider]  fluticasone (FLONASE) 50 MCG/ACT nasal spray Place 1 spray into both nostrils daily. 05/10/20   Floria Brandau, Veryl Speak, PA-C  gabapentin (NEURONTIN) 300 MG capsule Take by mouth. 06/16/19   [provider]  hydrochlorothiazide (HYDRODIURIL) 25 MG tablet Take by mouth. 10/12/19   [provider]    HYDROcodone-acetaminophen (NORCO/VICODIN) 5-325 MG tablet Take 2 tablets by mouth every 4 (four) hours as needed. 07/11/16   Long, Arlyss Repress, MD  ibuprofen (ADVIL,MOTRIN) 200 MG tablet Take 400 mg by mouth every 6 (six) hours as needed. For pain     [provider]  ipratropium (ATROVENT) 0.06 % nasal spray Place 2 sprays into both nostrils 4 (four) times daily. 01/28/14   Presson, Mathis Fare, PA  lidocaine (XYLOCAINE) 2 % solution Use as directed 15 mLs in the mouth or throat as needed for mouth pain. 02/07/20   Dahlia Byes A, NP  loratadine (CLARITIN) 10 MG tablet Take 1 tablet (10 mg total) by mouth daily. 01/28/14   Presson, Mathis Fare, PA  magic mouthwash SOLN Take 5 mLs by mouth 3 (three) times daily as needed for mouth pain. 07/11/16   Long, Arlyss Repress, MD  magic mouthwash w/lidocaine SOLN Take 15 mLs by mouth 4 (four) times daily as needed for mouth pain. Hold in mouth for 1-2 minutes then spit out 05/10/20   Keshona Kartes, Veryl Speak, PA-C  meloxicam (MOBIC) 15 MG tablet Take 1 tablet (15 mg total) by mouth daily. 02/01/13   Harris, Cammy Copa, PA-C  Multiple Vitamins-Minerals (VITACEL) TABS Take 1 tablet by mouth daily.    [provider]  naproxen sodium (ANAPROX) 220 MG tablet Take 220 mg by mouth 2 (two) times daily with a meal.    [provider]  phentermine 15 MG capsule Take by mouth. 07/27/19   [provider]  simvastatin (ZOCOR) 20 MG tablet TAKE ONE TABLET BY MOUTH EVERY NIGHT AT BEDTIME 10/12/19   [provider]  topiramate (TOPAMAX) 50 MG tablet Take 1 tablet (50 mg total) by mouth 2 (two) times daily. 04/07/17   Micki Riley, MD  triamcinolone (NASACORT) 55 MCG/ACT AERO nasal inhaler Place 2 sprays into the nose daily. 01/20/20   Georgetta Haber, NP  VITAMIN A PO Take by mouth.    [provider]  vitamin B-12 (CYANOCOBALAMIN) 100 MCG tablet Take by mouth.    [provider]  VITAMIN D, CHOLECALCIFEROL, PO Take by mouth.     [provider]  VITAMIN E PO Take by mouth.    [provider]    Family History Family History  Problem Relation Age of Onset  . Healthy Father   . Breast cancer Mother   . Breast cancer Sister     Social History Social History   Tobacco Use  . Smoking status: Former Smoker    Packs/day: 0.10    Years:  10.00    Pack years: 1.00    Types: Cigarettes  . Smokeless tobacco: Never Used  Substance Use Topics  . Alcohol use: No  . Drug use: No     Allergies   Sulfonamide derivatives and Tramadol hcl   Review of Systems Review of Systems   Physical Exam Triage Vital Signs ED Triage Vitals  Enc Vitals Group     BP 05/10/20 0844 (!) 148/78     Pulse Rate 05/10/20 0844 (!) 58     Resp 05/10/20 0844 16     Temp 05/10/20 0844 99 F (37.2 C)     Temp Source 05/10/20 0844 Oral     SpO2 05/10/20 0844 99 %     Weight --      Height --      Head Circumference --      Peak Flow --      Pain Score 05/10/20 0840 8     Pain Loc --      Pain Edu? --      Excl. in GC? --    No data found.  Updated Vital Signs BP (!) 148/78   Pulse (!) 58   Temp 99 F (37.2 C) (Oral)   Resp 16   SpO2 99%   Visual Acuity Right Eye Distance:   Left Eye Distance:   Bilateral Distance:    Right Eye Near:   Left Eye Near:    Bilateral Near:     Physical Exam Vitals and nursing note reviewed.  Constitutional:      General: She is not in acute distress.    Appearance: She is well-developed. She is not ill-appearing.  HENT:     Head: Normocephalic and atraumatic.     Right Ear: Tympanic membrane and ear canal normal.     Left Ear: Ear canal normal.     Ears:     Comments: No pain with manipulation of the tragus or auricle in the left ear.  Canal without erythema or swelling  Tympanic membrane with mild injection however nonbulging.  No significant erythema.  There is mild serous fluid behind the ear.  Good cone of light and bony landmarks appreciable.     Nose:     Comments: Bilateral turbinates with erythema and mild swelling.  There is clear rhinorrhea.  No facial tenderness.    Mouth/Throat:     Mouth: Mucous membranes are moist.     Pharynx: Uvula midline. No posterior oropharyngeal erythema.     Tonsils: No tonsillar exudate or tonsillar abscesses. 0 on the right. 0 on the left.     Comments: There is some cobblestoning and postnasal drip visible.  No visible ulcerations or lesions.  No significant erythema.  No uvular swelling.  Patient controlling secretions.  No tongue swelling.  No lesions under tongue.  Dentures removed no lesions of the soft palate or hard palate Eyes:     Conjunctiva/sclera: Conjunctivae normal.  Neck:     Comments: There is some tenderness along the left soft tissues of the neck however no significant masses.  Mild adenopathy bilaterally.  No submental tenderness or swelling Cardiovascular:     Rate and Rhythm: Normal rate and regular rhythm.     Heart sounds: No murmur.  Pulmonary:     Effort: Pulmonary effort is normal. No respiratory distress.     Breath sounds: Normal breath sounds.  Abdominal:     Palpations: Abdomen is soft.     Tenderness: There is  no abdominal tenderness.  Musculoskeletal:     Cervical back: Neck supple.  Skin:    General: Skin is warm and dry.  Neurological:     Mental Status: She is alert.      UC Treatments / Results  Labs (all labs ordered are listed, but only abnormal results are displayed) Labs Reviewed  CULTURE, GROUP A STREP ([facility])  SARS CORONAVIRUS 2 (TAT 6-24 HRS)  POCT RAPID STREP A    EKG   Radiology No results found.  Procedures Procedures (including critical care time)  Medications Ordered in UC Medications - No data to display  Initial Impression / Assessment and Plan / UC Course  I have reviewed the triage vital signs and the nursing notes.  Pertinent labs & imaging results that were available during my care of the patient were reviewed by  me and considered in my medical decision making (see chart for details).     #Sore throat #Serous otitis #Left ear pain Patient is a 64 year old with chronic and recurrent throat and ear pain.  She has previously had diagnosis is of aphthous ulcers is likely causing her issues.  This is confirmed via chart review visits in February 2021 and ENT visits 2019.  She has been trialed on Magic mouthwashes with good response.  She has had antibiotics without response previously.  There is no obvious sign of bacterial infection today the ear does not appear to be acute otitis media and throat exam not consistent with strep pharyngitis within negative rapid strep.  I do not see an ulcer however suspect there is an ulcer on the left side that is not visible on this exam.  We will send a throat culture.  We will treat her symptomatically and have her follow-up with primary care.  Return precautions are discussed.  Patient verbalized understanding of plan -We will also send Covid PCR to rule out Covid infection, though suspect this is related to her chronic issues. Final Clinical Impressions(s) / UC Diagnoses   Final diagnoses:  Sore throat  Left chronic serous otitis media  Left ear pain     Discharge Instructions     I want you to start using Flonase daily Continue your allergy medicine Take 2 regular strength Tylenol every 6-8 hours  I want you to use the Magic mouthwash, swish and gargle with this up to 4 times a day but do not swallow.  Ensure you spit out the contents.  Schedule follow-up with your primary care for next week for reevaluation  If significantly worsening or not improving over the next 3 days such that you develop fever or worsening ability to swallow please return.  Your rapid strep was negative and we sent a throat culture will notify of any necessary changes to your treatment  If your Covid-19 test is positive, you will [facility] your  results. Negative test results are not called. Both positive and negative results area always visible on MyChart. If you do not have a MyChart account, sign up instructions are in your discharge papers.   Persons who are directed to care for themselves at home may discontinue isolation under the following conditions:  . At least 10 days have passed since symptom onset and . At least 24 hours have passed without running a fever (this means without the use of fever-reducing medications) and . Other symptoms have improved.  Persons infected with COVID-19 who never develop symptoms may discontinue isolation and other  precautions 10 days after the date of their first positive COVID-19 test.      ED Prescriptions    Medication Sig Dispense Auth. Provider   magic mouthwash w/lidocaine SOLN Take 15 mLs by mouth 4 (four) times daily as needed for mouth pain. Hold in mouth for 1-2 minutes then spit out 320 mL Vivan Vanderveer, Veryl Speak, PA-C   fluticasone (FLONASE) 50 MCG/ACT nasal spray Place 1 spray into both nostrils daily. 15.8 mL Demetress Tift, Veryl Speak, PA-C     PDMP not reviewed this encounter.   Hermelinda Medicus, PA-C 05/10/20 1032

## 2020-05-11 LAB — SARS CORONAVIRUS 2 (TAT 6-24 HRS): SARS Coronavirus 2: NEGATIVE

## 2020-05-12 LAB — CULTURE, GROUP A STREP (THRC)

## 2020-08-15 ENCOUNTER — Other Ambulatory Visit: Payer: Self-pay | Admitting: General Practice

## 2020-08-15 DIAGNOSIS — Z1231 Encounter for screening mammogram for malignant neoplasm of breast: Secondary | ICD-10-CM

## 2020-08-17 ENCOUNTER — Other Ambulatory Visit: Payer: Self-pay

## 2020-08-17 ENCOUNTER — Ambulatory Visit
Admission: RE | Admit: 2020-08-17 | Discharge: 2020-08-17 | Disposition: A | Discharge status: Home / Self Care | Payer: Medicare Other | Source: Ambulatory Visit | Attending: General Practice | Admitting: General Practice

## 2020-08-17 DIAGNOSIS — Z1231 Encounter for screening mammogram for malignant neoplasm of breast: Secondary | ICD-10-CM

## 2020-10-11 ENCOUNTER — Ambulatory Visit
Admission: RE | Admit: 2020-10-11 | Discharge: 2020-10-11 | Disposition: A | Discharge status: Home / Self Care | Payer: Medicare Other | Source: Ambulatory Visit | Attending: General Practice | Admitting: General Practice

## 2020-10-11 ENCOUNTER — Other Ambulatory Visit: Payer: Self-pay | Admitting: General Practice

## 2020-10-11 DIAGNOSIS — M79671 Pain in right foot: Secondary | ICD-10-CM

## 2021-01-25 ENCOUNTER — Ambulatory Visit (INDEPENDENT_AMBULATORY_CARE_PROVIDER_SITE_OTHER): Payer: Medicare Other

## 2021-01-25 ENCOUNTER — Ambulatory Visit (INDEPENDENT_AMBULATORY_CARE_PROVIDER_SITE_OTHER): Payer: Medicare Other | Admitting: Podiatry

## 2021-01-25 ENCOUNTER — Other Ambulatory Visit: Payer: Self-pay

## 2021-01-25 DIAGNOSIS — M7731 Calcaneal spur, right foot: Secondary | ICD-10-CM

## 2021-01-25 DIAGNOSIS — M79672 Pain in left foot: Secondary | ICD-10-CM

## 2021-01-25 DIAGNOSIS — M216X9 Other acquired deformities of unspecified foot: Secondary | ICD-10-CM

## 2021-01-25 DIAGNOSIS — M7661 Achilles tendinitis, right leg: Secondary | ICD-10-CM

## 2021-01-25 DIAGNOSIS — F32A Depression, unspecified: Secondary | ICD-10-CM | POA: Insufficient documentation

## 2021-01-25 DIAGNOSIS — M722 Plantar fascial fibromatosis: Secondary | ICD-10-CM

## 2021-01-25 DIAGNOSIS — M1990 Unspecified osteoarthritis, unspecified site: Secondary | ICD-10-CM | POA: Insufficient documentation

## 2021-01-25 DIAGNOSIS — M7732 Calcaneal spur, left foot: Secondary | ICD-10-CM

## 2021-01-25 DIAGNOSIS — M79671 Pain in right foot: Secondary | ICD-10-CM

## 2021-01-25 MED ORDER — METHYLPREDNISOLONE 4 MG PO TBPK: ORAL_TABLET | ORAL | 0 refills | Status: DC

## 2021-01-25 NOTE — Patient Instructions (Signed)

## 2021-01-25 NOTE — Progress Notes (Signed)
  Subjective:  Patient ID: Leah Baird, female    DOB: 06/17/1956,  MRN: [id number]  Chief Complaint  Patient presents with  . Foot Pain    Bilateral plantar/posterior heel pain,1 yr duration. Pt states pain worse after periods of rest. Pt states did not walk for 1 entire year due to surgical injury.   65 y.o. female presents with the above complaint. History confirmed with patient.  Objective:  Physical Exam: warm, good capillary refill, no trophic changes or ulcerative lesions, normal DP and PT pulses and normal sensory exam. Left Foot: tenderness to palpation medial calcaneal tuber, no pain with calcaneal squeeze, decreased ankle joint ROM and +Silverskiold test Right Foot: tenderness to palpation medial calcaneal tuber, tenderness to palpation posterior calcaneus, no pain with calcaneal squeeze, decreased ankle joint ROM and +Silverskiold test normal exam, no swelling, tenderness, instability; ligaments intact, full range of motion of all ankle/foot joints other than findings noted above.  Radiographs: X-ray of both feet: no evidence of calcaneal stress fracture, plantar calcaneal spur, posterior calcaneal spur and Haglund deformity noted  Assessment:   1. Plantar fasciitis   2. Equinus deformity of foot   3. Calcaneal spur of left foot   4. Calcaneal spur of right foot   5. Achilles tendinitis of right lower extremity    Plan:  Patient was evaluated and treated and all questions answered.  Achilles Tendonitis and Plantar Fasciitis -XR reviewed with patient -Educated patient on stretching and icing of the affected limb -Plantar fascial brace dispensed x2 -Rx for medrol pack. Educated on use, risks, and benefits of the medication  Return in about 4 weeks (around 02/22/2021) for Plantar fasciitis.

## 2021-01-30 ENCOUNTER — Other Ambulatory Visit: Payer: Self-pay | Admitting: Podiatry

## 2021-01-30 DIAGNOSIS — M7732 Calcaneal spur, left foot: Secondary | ICD-10-CM

## 2021-01-30 DIAGNOSIS — M7731 Calcaneal spur, right foot: Secondary | ICD-10-CM

## 2021-04-30 ENCOUNTER — Ambulatory Visit (INDEPENDENT_AMBULATORY_CARE_PROVIDER_SITE_OTHER): Payer: Medicare Other | Admitting: Podiatry

## 2021-04-30 ENCOUNTER — Other Ambulatory Visit: Payer: Self-pay

## 2021-04-30 DIAGNOSIS — M722 Plantar fascial fibromatosis: Secondary | ICD-10-CM

## 2021-04-30 MED ORDER — BETAMETHASONE SOD PHOS & ACET 6 (3-3) MG/ML IJ SUSP
12.0000 mg | Freq: Once | INTRAMUSCULAR | Status: AC
  Administered 2021-04-30: 12 mg

## 2021-04-30 NOTE — Progress Notes (Signed)
  Subjective:  Patient ID: Leah Baird, female    DOB: 17-Dec-1955,  MRN: [id number]  Chief Complaint  Patient presents with  . Plantar Fasciitis    Bilateral PF. Pt states pain has increased.    65 y.o. female presents with the above complaint. History confirmed with patient.  Objective:  Physical Exam: warm, good capillary refill, no trophic changes or ulcerative lesions, normal DP and PT pulses and normal sensory exam. Left Foot: tenderness to palpation medial calcaneal tuber, no pain with calcaneal squeeze, decreased ankle joint ROM and +Silverskiold test Right Foot: tenderness to palpation medial calcaneal tuber, no pain with calcaneal squeeze, decreased ankle joint ROM and +Silverskiold test normal exam, no swelling, tenderness, instability; ligaments intact, full range of motion of all ankle/foot joints other than findings noted above.  Assessment:   1. Plantar fasciitis    Plan:  Patient was evaluated and treated and all questions answered.  Achilles Tendonitis and Plantar Fasciitis -Injection delivered to the plantar fascia of both feet. -Continue PF brace, stretching, icing.  Procedure: Injection Tendon/Ligament Consent: Verbal consent obtained. Location: Bilateral plantar fascia at the glabrous junction; medial approach. Skin Prep: Alcohol. Injectate: 1 cc 0.5% marcaine plain, 1 cc betamethasone acetate-betamethasone sodium phosphate Disposition: Patient tolerated procedure well. Injection site dressed with a band-aid.  No follow-ups on file.

## 2021-05-28 ENCOUNTER — Ambulatory Visit: Payer: Medicare Other | Admitting: Podiatry

## 2021-05-28 ENCOUNTER — Other Ambulatory Visit: Payer: Self-pay

## 2021-06-04 ENCOUNTER — Other Ambulatory Visit: Payer: Self-pay

## 2021-06-04 ENCOUNTER — Ambulatory Visit (INDEPENDENT_AMBULATORY_CARE_PROVIDER_SITE_OTHER): Payer: Medicare Other | Admitting: Podiatry

## 2021-06-04 DIAGNOSIS — M216X9 Other acquired deformities of unspecified foot: Secondary | ICD-10-CM

## 2021-06-04 DIAGNOSIS — M722 Plantar fascial fibromatosis: Secondary | ICD-10-CM

## 2021-06-04 DIAGNOSIS — M2012 Hallux valgus (acquired), left foot: Secondary | ICD-10-CM

## 2021-06-04 DIAGNOSIS — M2011 Hallux valgus (acquired), right foot: Secondary | ICD-10-CM

## 2021-06-04 NOTE — Progress Notes (Signed)
  Subjective:  Patient ID: Leah Baird, female    DOB: May 31, 1956,  MRN: [id number]  Chief Complaint  Patient presents with   Follow-up    Slight improvement. Reports sx are worse when standing up after sitting. Also reports great toes moving outwards and bothering 2nd toes.    65 y.o. female presents with the above complaint. History confirmed with patient.  Objective:  Physical Exam: warm, good capillary refill, no trophic changes or ulcerative lesions, normal DP and PT pulses and normal sensory exam. Left Foot: mild tenderness to palpation medial calcaneal tuber, no pain with calcaneal squeeze, decreased ankle joint ROM and +Silverskiold test. HAV deformity Right Foot: mild tenderness to palpation medial calcaneal tuber, no pain with calcaneal squeeze, decreased ankle joint ROM and +Silverskiold test HAV deformity  Assessment:   1. Plantar fasciitis   2. Equinus deformity of foot   3. Acquired hallux valgus of both feet     Plan:  Patient was evaluated and treated and all questions answered.  Achilles Tendonitis and Plantar Fasciitis -Hold off further injection today. -Dispense new plantar fascial braces x2 - patient states her ones are no longer holding and she cannot use.  HAV bilat -Dispense toe spacer bilat  No follow-ups on file.

## 2021-06-21 ENCOUNTER — Other Ambulatory Visit: Payer: Self-pay | Admitting: Gastroenterology

## 2021-06-21 DIAGNOSIS — R1310 Dysphagia, unspecified: Secondary | ICD-10-CM

## 2021-06-25 ENCOUNTER — Other Ambulatory Visit: Payer: Medicare Other

## 2021-07-03 ENCOUNTER — Other Ambulatory Visit: Payer: Medicare Other

## 2021-07-23 ENCOUNTER — Ambulatory Visit
Admission: RE | Admit: 2021-07-23 | Discharge: 2021-07-23 | Disposition: A | Discharge status: Home / Self Care | Payer: Medicare Other | Source: Ambulatory Visit | Attending: Gastroenterology | Admitting: Gastroenterology

## 2021-07-23 ENCOUNTER — Other Ambulatory Visit: Payer: Self-pay

## 2021-07-23 DIAGNOSIS — R1310 Dysphagia, unspecified: Secondary | ICD-10-CM

## 2021-08-06 ENCOUNTER — Ambulatory Visit (INDEPENDENT_AMBULATORY_CARE_PROVIDER_SITE_OTHER): Payer: Medicare Other | Admitting: Podiatry

## 2021-08-06 ENCOUNTER — Other Ambulatory Visit: Payer: Self-pay

## 2021-08-06 DIAGNOSIS — M2011 Hallux valgus (acquired), right foot: Secondary | ICD-10-CM

## 2021-08-06 DIAGNOSIS — M722 Plantar fascial fibromatosis: Secondary | ICD-10-CM | POA: Diagnosis not present

## 2021-08-06 DIAGNOSIS — M216X9 Other acquired deformities of unspecified foot: Secondary | ICD-10-CM | POA: Diagnosis not present

## 2021-08-06 DIAGNOSIS — M7661 Achilles tendinitis, right leg: Secondary | ICD-10-CM | POA: Diagnosis not present

## 2021-08-06 DIAGNOSIS — M2012 Hallux valgus (acquired), left foot: Secondary | ICD-10-CM

## 2021-08-06 MED ORDER — METHYLPREDNISOLONE 4 MG PO TBPK: ORAL_TABLET | ORAL | 0 refills | Status: AC

## 2021-08-06 NOTE — Progress Notes (Signed)
  Subjective:  Patient ID: Leah Baird, female    DOB: [birth year],  MRN: [id number]  Chief Complaint  Patient presents with   Plantar Fasciitis    PT stated  that she is still having some pain with her feet    65 y.o. female presents with the above complaint. History confirmed with patient.  Objective:  Physical Exam: warm, good capillary refill, no trophic changes or ulcerative lesions, normal DP and PT pulses and normal sensory exam. Left Foot: mild tenderness to palpation medial calcaneal tuber, no pain with calcaneal squeeze, decreased ankle joint ROM and +Silverskiold test. HAV deformity. POP achilles tendon  Assessment:   1. Plantar fasciitis   2. Equinus deformity of foot   3. Acquired hallux valgus of both feet   4. Achilles tendinitis of right lower extremity    Plan:  Patient was evaluated and treated and all questions answered.  Achilles Tendonitis and Plantar Fasciitis -Still having pain, hold off injections considering Achilles tendonitis - rx Medrol.  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

## 2021-09-09 ENCOUNTER — Encounter: Payer: Self-pay | Admitting: Podiatry

## 2021-09-17 ENCOUNTER — Ambulatory Visit: Payer: Medicare Other | Admitting: Podiatry

## 2021-10-29 IMAGING — RF DG ESOPHAGUS
5 series · 14 of 19 positions shown · non-contrast
Comparison: None.

CLINICAL DATA: Dysphasia.

EXAM:
ESOPHOGRAM / BARIUM SWALLOW / BARIUM TABLET STUDY
TECHNIQUE: Combined double contrast and single contrast examination performed
using effervescent crystals, thick barium liquid, and thin barium
liquid. The patient was observed with fluoroscopy swallowing a 13 mm
barium sulphate tablet.
FLUOROSCOPY TIME:  Fluoroscopy Time:  1 minutes 42 seconds
Radiation Exposure Index (if provided by the fluoroscopic device):
32.5 mGy
Number of Acquired Spot Images: 0

[Series 1: sequence · 3 of 67 frames shown (1 of 4)]
[frame 11/67]
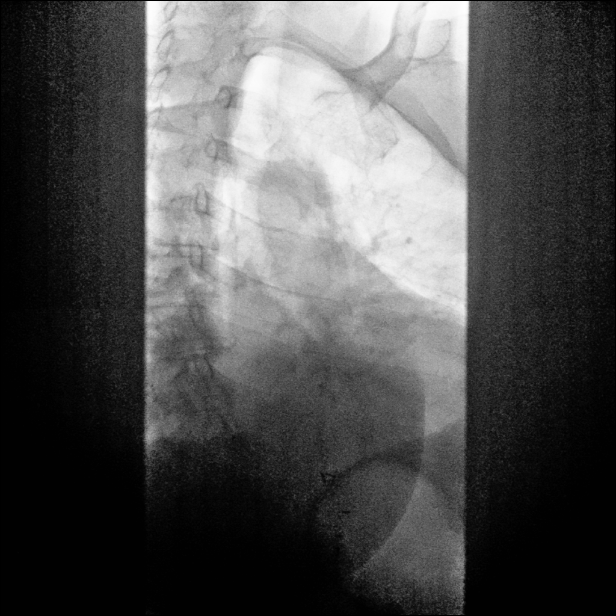
[frame 57/67]
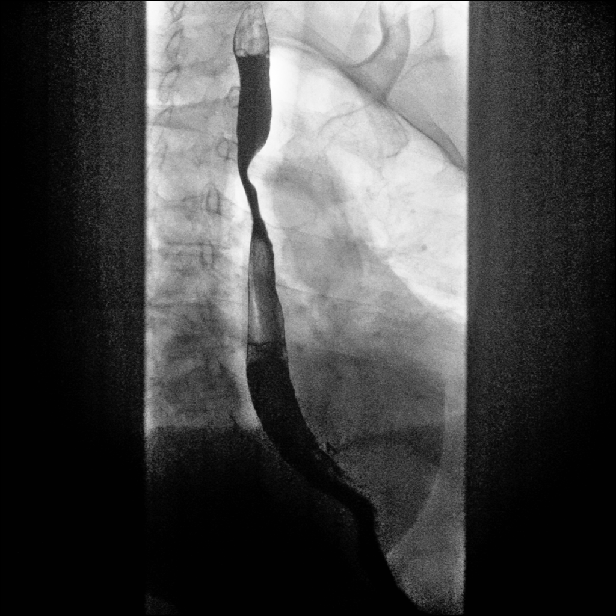
[frame 61/67]
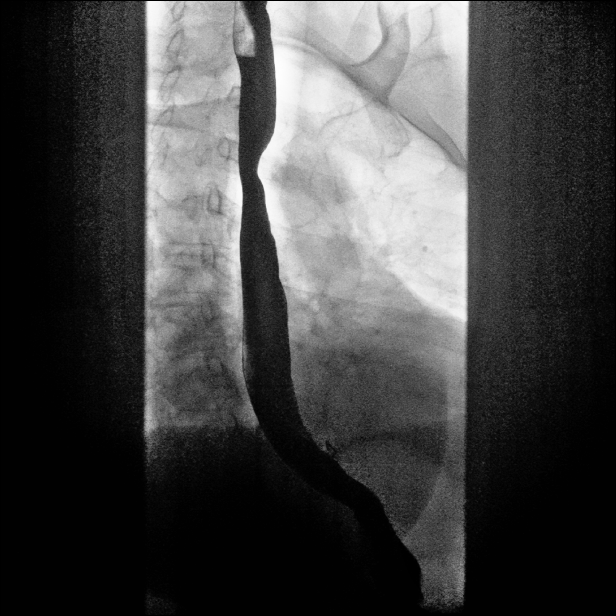

[Series 2: one shot · 2 of 3 slices shown]
[im 1/3]
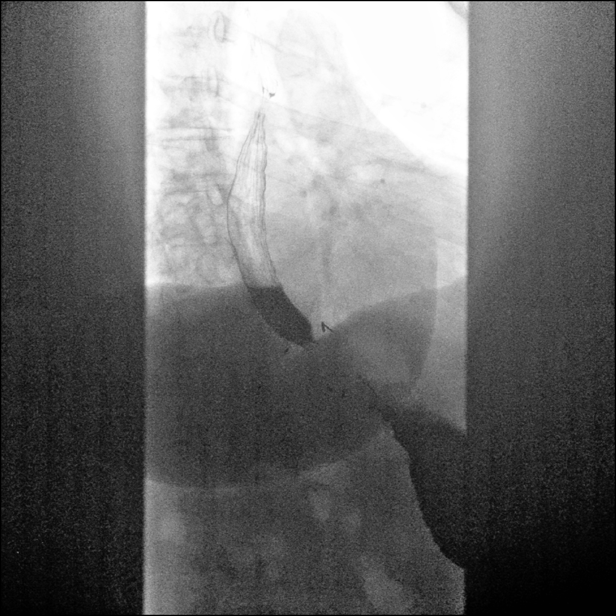
[im 3/3]
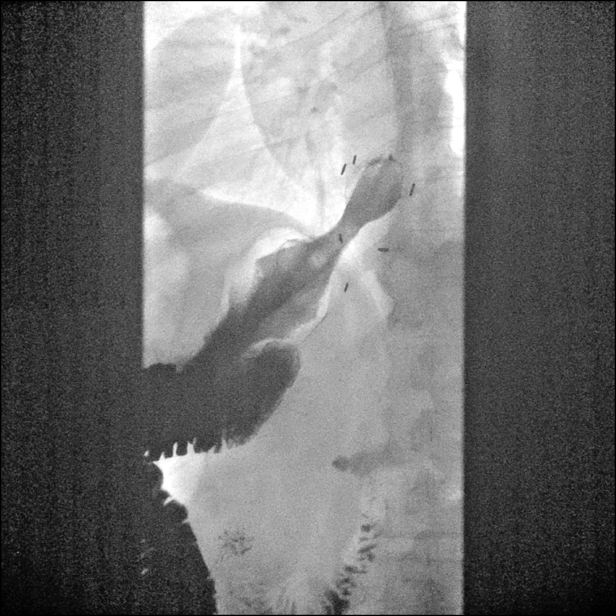

[Series 3: sequence · 3 of 126 frames shown (2 of 4)]
[frame 19/126]
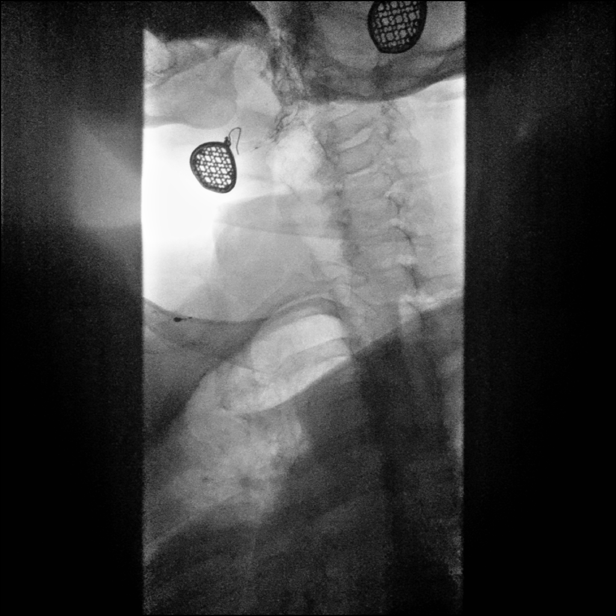
[frame 54/126]
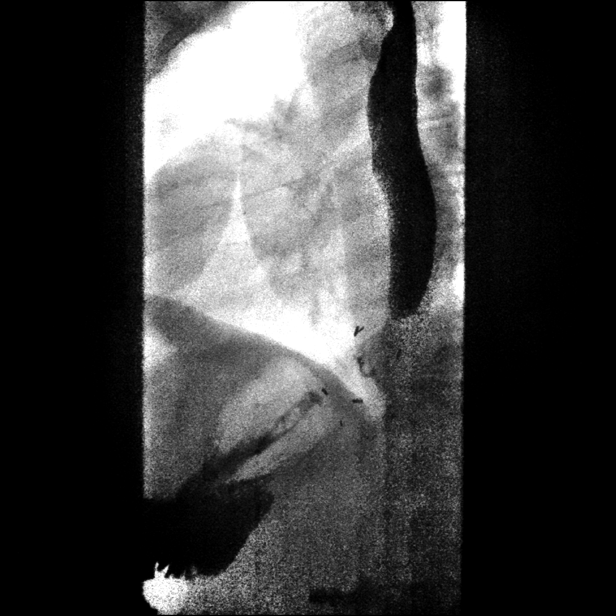
[frame 108/126]
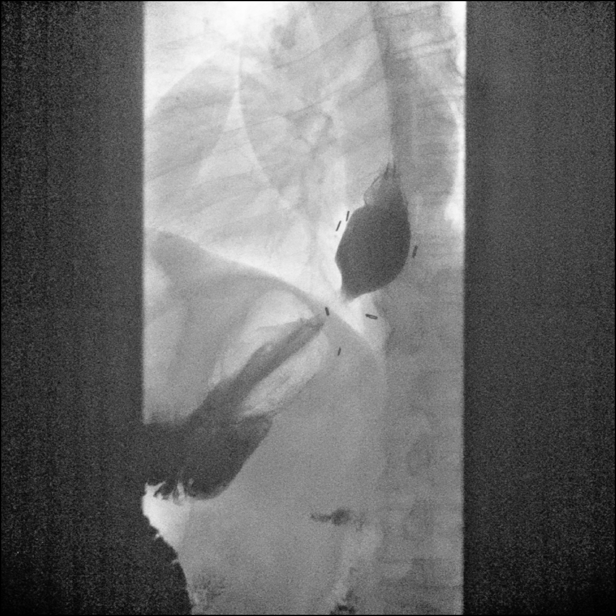

[Series 4: sequence · 3 of 41 frames shown (3 of 4)]
[frame 7/41]
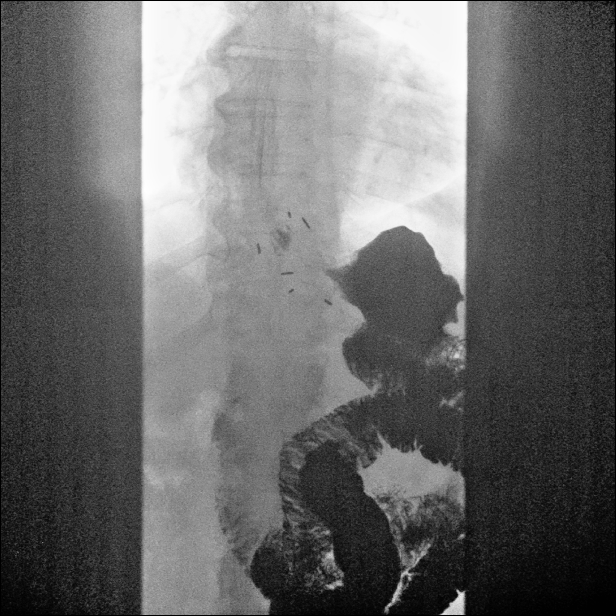
[frame 21/41]
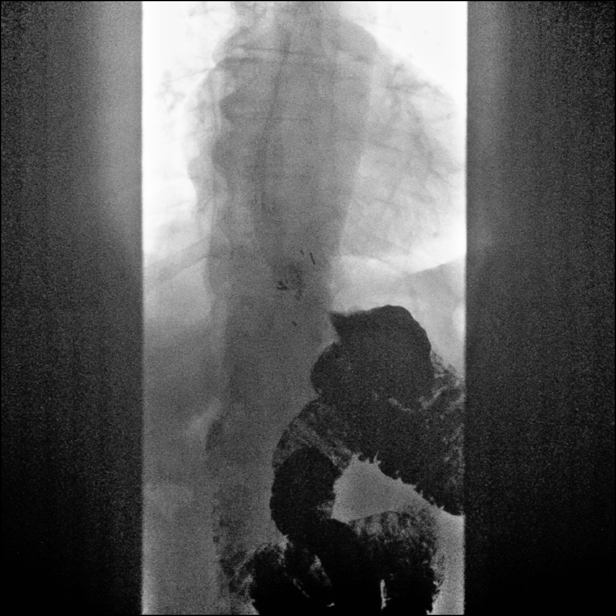
[frame 35/41]
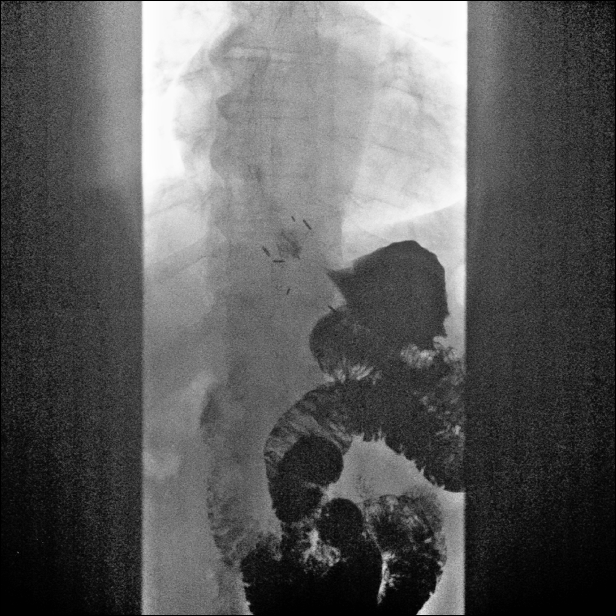

[Series 5: sequence · 3 of 58 frames shown (4 of 4)]
[frame 9/58]
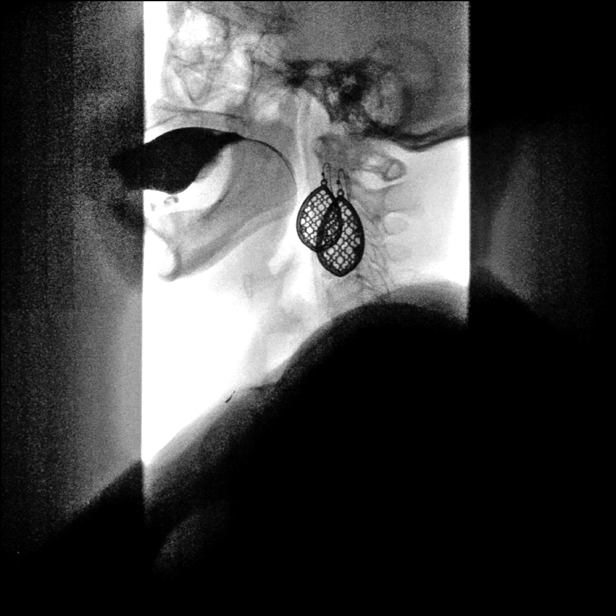
[frame 18/58]
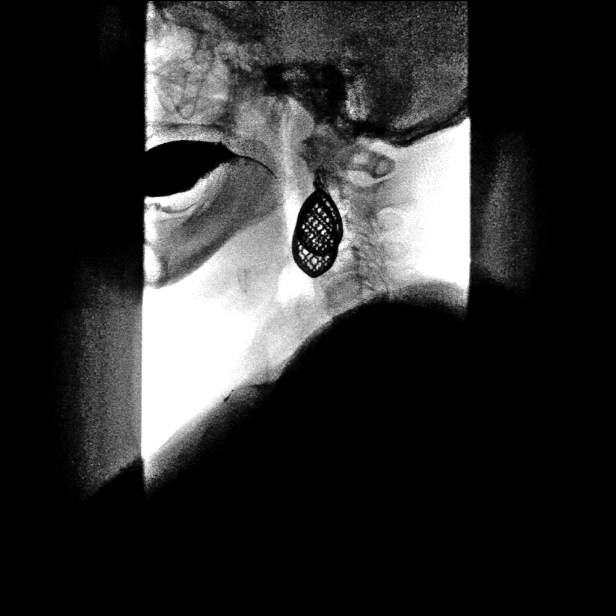
[frame 50/58]
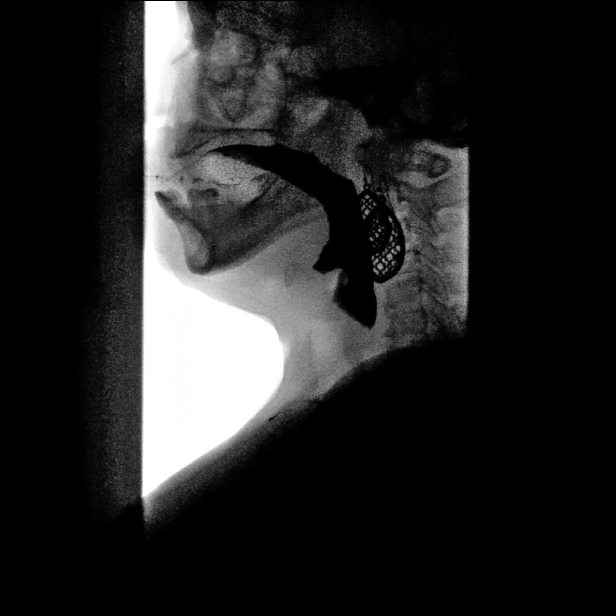

[14 of 19 positions shown; findings below may reference images not displayed]

FINDINGS: The swallowing mechanism appears normal.  No aspiration identified.

The esophagus appears patent. There is no stricture or mass
identified. A 13 mm barium tablet was ingested which easily passed
through the esophagus and into the stomach.

The motility of the esophagus appears normal.

No hiatal hernia or reflux identified.

Limited imaging of the upper abdomen shows postoperative changes
from partial gastrectomy.
IMPRESSION: 1. Normal appearance of the esophagus. No findings to explain
patient's dysphasia.

## 2022-01-25 ENCOUNTER — Other Ambulatory Visit: Payer: Self-pay | Admitting: Nurse Practitioner

## 2022-01-25 DIAGNOSIS — Z780 Asymptomatic menopausal state: Secondary | ICD-10-CM

## 2022-01-27 ENCOUNTER — Other Ambulatory Visit: Payer: Self-pay | Admitting: Nurse Practitioner

## 2022-01-27 DIAGNOSIS — Z1231 Encounter for screening mammogram for malignant neoplasm of breast: Secondary | ICD-10-CM

## 2022-02-10 ENCOUNTER — Ambulatory Visit: Payer: Medicare Other

## 2022-06-23 ENCOUNTER — Other Ambulatory Visit: Payer: Medicare Other

## 2022-09-30 ENCOUNTER — Ambulatory Visit (HOSPITAL_COMMUNITY)
Admission: EM | Admit: 2022-09-30 | Discharge: 2022-09-30 | Disposition: A | Discharge status: Home / Self Care | Payer: Medicare Other | Attending: Family Medicine | Admitting: Family Medicine

## 2022-09-30 ENCOUNTER — Other Ambulatory Visit: Payer: Self-pay

## 2022-09-30 ENCOUNTER — Encounter (HOSPITAL_COMMUNITY): Payer: Self-pay | Admitting: Emergency Medicine

## 2022-09-30 DIAGNOSIS — J029 Acute pharyngitis, unspecified: Secondary | ICD-10-CM | POA: Diagnosis not present

## 2022-09-30 DIAGNOSIS — H9203 Otalgia, bilateral: Secondary | ICD-10-CM | POA: Diagnosis not present

## 2022-09-30 MED ORDER — LIDOCAINE VISCOUS HCL 2 % MT SOLN: 5.0000 mL | Freq: Four times a day (QID) | OROMUCOSAL | 0 refills | Status: AC | PRN

## 2022-09-30 MED ORDER — HYDROXYZINE HCL 25 MG PO TABS: ORAL_TABLET | ORAL | 0 refills | Status: AC

## 2022-09-30 MED ORDER — CLOTRIMAZOLE 10 MG MT TROC: 10.0000 mg | Freq: Every day | OROMUCOSAL | 0 refills | Status: AC

## 2022-09-30 NOTE — ED Provider Notes (Signed)
[place]   [id number] 09/30/22 Arrival Time: 7824  ASSESSMENT & PLAN:  1. Sore throat   2. Otalgia of both ears    Discussed typical duration of suspected viral illness. No signs of bacterial infection. Will also treat for possibility of oral candidiasis given denture use.  Meds ordered this encounter  Medications   magic mouthwash (lidocaine, diphenhydrAMINE, alum & mag hydroxide) suspension    Sig: Swish and spit 5 mLs 4 (four) times daily as needed for mouth pain.    Dispense:  360 mL    Refill:  0   hydrOXYzine (ATARAX) 25 MG tablet    Sig: Take 1-2 tablets every 6 hours as needed.    Dispense:  30 tablet    Refill:  0   clotrimazole (MYCELEX) 10 MG troche    Sig: Take 1 tablet (10 mg total) by mouth 5 (five) times daily.    Dispense:  70 tablet    Refill:  0    Follow-up Information     Andree Moro, DO.   Specialty: General Practice Why: If worsening or failing to improve as anticipated. Contact information: [place] [address] [phone]         Schedule an appointment as soon as possible for a visit  with [facility].   Contact information: [address] [place] [address] [phone]                Reviewed expectations re: course of current medical issues. Questions answered. Outlined signs and symptoms indicating need for more acute intervention. Understanding verbalized. After Visit Summary given.   SUBJECTIVE: History from: Patient. Leah Baird is a 66 y.o. female. Reports sore throat and ear pain started 2-3 days ago. Initially had a slight fever.  Denies productive cough, only dry.  Also has been sneezing. Painful swallowing limiting PO intake. No n/v. Wears dentures. Various OTC without relief.  OBJECTIVE:  Vitals:   09/30/22 1541  BP: (!) 148/53  Pulse: 63  Resp: 20  Temp: 98.7 F (37.1 C)  TempSrc: Oral  SpO2: 95%    General  appearance: alert; no distress Eyes: PERRLA; EOMI; conjunctiva normal HENT: ; AT; with nasal congestion; bilateral serous otitis Neck: supple  Lungs: speaks full sentences without difficulty; unlabored Extremities: no edema Skin: warm and dry Neurologic: normal gait Psychological: alert and cooperative; normal mood and affect  Allergies  Allergen Reactions   Sulfonamide Derivatives Hives   Tramadol Hcl     REACTION: Hives    Past Medical History:  Diagnosis Date   Depression    Hypertension    Mental disorder    Social History   Socioeconomic History   Marital status: Widowed    Spouse name: Not on file   Number of children: Not on file   Years of education: Not on file   Highest education level: Not on file  Occupational History   Not on file  Tobacco Use   Smoking status: Former    Packs/day: 0.10    Years: 10.00    Total pack years: 1.00    Types: Cigarettes   Smokeless tobacco: Never  Vaping Use   Vaping Use: Never used  Substance and Sexual Activity   Alcohol use: No   Drug use: No   Sexual activity: Not Currently  Other Topics Concern   Not on file  Social History Narrative   Not on file   Social Determinants  of Health   Financial Resource Strain: Not on file  Food Insecurity: Not on file  Transportation Needs: Not on file  Physical Activity: Not on file  Stress: Not on file  Social Connections: Not on file  Intimate Partner Violence: Not on file   Family History  Problem Relation Age of Onset   Healthy Father    Breast cancer Mother    Breast cancer Sister    Past Surgical History:  Procedure Laterality Date   ABDOMINAL HYSTERECTOMY     partial   cyst removed from spine     TUBAL LIGATION Bilateral      Vanessa Kick, MD 09/30/22 1617

## 2022-09-30 NOTE — ED Triage Notes (Signed)
Complains of sore throat and ear pain started 2-3 days ago.  Initially had a slight fever.  Denies productive cough, only dry.  Also has been sneezing.

## 2024-12-19 ENCOUNTER — Emergency Department (HOSPITAL_COMMUNITY)

## 2024-12-19 ENCOUNTER — Emergency Department (HOSPITAL_COMMUNITY)
Admission: EM | Admit: 2024-12-19 | Discharge: 2024-12-19 | Disposition: A | Discharge status: Home / Self Care | Attending: Emergency Medicine | Admitting: Emergency Medicine

## 2024-12-19 ENCOUNTER — Other Ambulatory Visit: Payer: Self-pay

## 2024-12-19 DIAGNOSIS — Z79899 Other long term (current) drug therapy: Secondary | ICD-10-CM | POA: Insufficient documentation

## 2024-12-19 DIAGNOSIS — R1010 Upper abdominal pain, unspecified: Secondary | ICD-10-CM

## 2024-12-19 DIAGNOSIS — R1013 Epigastric pain: Secondary | ICD-10-CM | POA: Diagnosis not present

## 2024-12-19 DIAGNOSIS — M791 Myalgia, unspecified site: Secondary | ICD-10-CM | POA: Diagnosis not present

## 2024-12-19 DIAGNOSIS — R0602 Shortness of breath: Secondary | ICD-10-CM | POA: Insufficient documentation

## 2024-12-19 DIAGNOSIS — R0789 Other chest pain: Secondary | ICD-10-CM | POA: Insufficient documentation

## 2024-12-19 DIAGNOSIS — R079 Chest pain, unspecified: Secondary | ICD-10-CM | POA: Diagnosis present

## 2024-12-19 DIAGNOSIS — I10 Essential (primary) hypertension: Secondary | ICD-10-CM | POA: Diagnosis not present

## 2024-12-19 DIAGNOSIS — Z7982 Long term (current) use of aspirin: Secondary | ICD-10-CM | POA: Diagnosis not present

## 2024-12-19 LAB — CBC WITH DIFFERENTIAL/PLATELET
Abs Immature Granulocytes: 0.02 K/uL (ref 0.00–0.07)
Basophils Absolute: 0 K/uL (ref 0.0–0.1)
Basophils Relative: 1 %
Eosinophils Absolute: 0.2 K/uL (ref 0.0–0.5)
Eosinophils Relative: 3 %
HCT: 38.6 % (ref 36.0–46.0)
Hemoglobin: 12.2 g/dL (ref 12.0–15.0)
Immature Granulocytes: 0 %
Lymphocytes Relative: 23 %
Lymphs Abs: 1.3 K/uL (ref 0.7–4.0)
MCH: 28.6 pg (ref 26.0–34.0)
MCHC: 31.6 g/dL (ref 30.0–36.0)
MCV: 90.6 fL (ref 80.0–100.0)
Monocytes Absolute: 0.5 K/uL (ref 0.1–1.0)
Monocytes Relative: 9 %
Neutro Abs: 3.7 K/uL (ref 1.7–7.7)
Neutrophils Relative %: 64 %
Platelets: 286 K/uL (ref 150–400)
RBC: 4.26 MIL/uL (ref 3.87–5.11)
RDW: 14 % (ref 11.5–15.5)
WBC: 5.8 K/uL (ref 4.0–10.5)
nRBC: 0 % (ref 0.0–0.2)

## 2024-12-19 LAB — COMPREHENSIVE METABOLIC PANEL WITH GFR
ALT: 13 U/L (ref 0–44)
AST: 33 U/L (ref 15–41)
Albumin: 4.3 g/dL (ref 3.5–5.0)
Alkaline Phosphatase: 98 U/L (ref 38–126)
Anion gap: 13 (ref 5–15)
BUN: 21 mg/dL (ref 8–23)
CO2: 23 mmol/L (ref 22–32)
Calcium: 9.5 mg/dL (ref 8.9–10.3)
Chloride: 105 mmol/L (ref 98–111)
Creatinine, Ser: 1.2 mg/dL — ABNORMAL HIGH (ref 0.44–1.00)
GFR, Estimated: 49 mL/min — ABNORMAL LOW
Glucose, Bld: 104 mg/dL — ABNORMAL HIGH (ref 70–99)
Potassium: 4.8 mmol/L (ref 3.5–5.1)
Sodium: 141 mmol/L (ref 135–145)
Total Bilirubin: 0.2 mg/dL (ref 0.0–1.2)
Total Protein: 7.9 g/dL (ref 6.5–8.1)

## 2024-12-19 LAB — TROPONIN T, HIGH SENSITIVITY: Troponin T High Sensitivity: <15 ng/L (ref 0–19)

## 2024-12-19 LAB — I-STAT CG4 LACTIC ACID, ED
Lactic Acid, Venous: 2 mmol/L (ref 0.5–1.9)
Lactic Acid, Venous: 0.6 mmol/L (ref 0.5–1.9)

## 2024-12-19 LAB — LIPASE, BLOOD: Lipase: 44 U/L (ref 11–51)

## 2024-12-19 LAB — PRO BRAIN NATRIURETIC PEPTIDE: Pro Brain Natriuretic Peptide: 155 pg/mL

## 2024-12-19 MED ORDER — LIDOCAINE VISCOUS HCL 2 % MT SOLN
15.0000 mL | Freq: Once | OROMUCOSAL | Status: AC
  Administered 2024-12-19: 15 mL via ORAL
  Filled 2024-12-19: qty 15

## 2024-12-19 MED ORDER — IOHEXOL 350 MG/ML SOLN
100.0000 mL | Freq: Once | INTRAVENOUS | Status: AC | PRN
  Administered 2024-12-19: 100 mL via INTRAVENOUS

## 2024-12-19 MED ORDER — ALUM & MAG HYDROXIDE-SIMETH 200-200-20 MG/5ML PO SUSP
30.0000 mL | Freq: Once | ORAL | Status: AC
  Administered 2024-12-19: 30 mL via ORAL
  Filled 2024-12-19: qty 30

## 2024-12-19 MED ORDER — CYCLOBENZAPRINE HCL 5 MG PO TABS: 5.0000 mg | ORAL_TABLET | Freq: Two times a day (BID) | ORAL | 0 refills | Status: AC | PRN

## 2024-12-19 MED ORDER — OMEPRAZOLE 20 MG PO CPDR: 20.0000 mg | DELAYED_RELEASE_CAPSULE | Freq: Every day | ORAL | 0 refills | Status: AC

## 2024-12-19 MED ORDER — LIDOCAINE 5 % EX PTCH: 1.0000 | MEDICATED_PATCH | CUTANEOUS | 0 refills | Status: AC

## 2024-12-19 NOTE — ED Provider Notes (Signed)
 " [place] EMERGENCY DEPARTMENT AT [facility] Provider Note   CSN: [id number] Arrival date & time: 12/19/24  9341     Patient presents with: Chest Pain   Leah Baird is a 69 y.o. female.   The history is provided by the patient and medical records. No language interpreter was used.  Chest Pain Pain location:  Substernal area, epigastric and R chest Pain quality: aching, dull and sharp   Pain radiates to:  Epigastrium Pain severity:  Severe Onset quality:  Gradual Duration:  2 days Timing:  Constant Progression:  Waxing and waning Chronicity:  New Relieved by:  Nothing Worsened by:  Coughing, deep breathing, movement and certain positions Ineffective treatments:  None tried Associated symptoms: abdominal pain, heartburn and shortness of breath   Associated symptoms: no altered mental status, no back pain, no cough, no fatigue, no fever, no headache, no nausea, no palpitations and no vomiting        Prior to Admission medications  Medication Sig Start Date End Date Taking? Authorizing Provider  acetaminophen -codeine  (TYLENOL  #3) 300-30 MG tablet Take 1 tablet by mouth every 8 (eight) hours as needed for moderate pain. 05/24/19   Barbarann Oneil BROCKS, MD  ascorbic acid (VITAMIN C) 500 MG tablet Take by mouth.    [provider]  aspirin-acetaminophen -caffeine (EXCEDRIN MIGRAINE) 250-250-65 MG tablet Take by mouth.    [provider]  baclofen (LIORESAL) 10 MG tablet Take by mouth.    [provider]  baclofen (LIORESAL) 10 MG tablet Take by mouth. 06/16/19   [provider]  benzonatate  (TESSALON ) 100 MG capsule Take 1 capsule (100 mg total) by mouth 3 (three) times daily as needed for cough. Patient not taking: Reported on 09/30/2022 01/28/14   Willy Delon Jama VEAR, PA  BIOTIN PO Take by mouth.    [provider]  clobetasol (TEMOVATE) 0.05 % external solution Apply topically. 10/04/20   [provider]   clotrimazole  (MYCELEX ) 10 MG troche Take 1 tablet (10 mg total) by mouth 5 (five) times daily. 09/30/22   Rolinda Rogue, MD  diclofenac (VOLTAREN) 75 MG EC tablet  03/04/19   [provider]  diclofenac (VOLTAREN) 75 MG EC tablet TAKE 1 TABLET BY MOUTH 2 TIMES DAILY 06/16/19   [provider]  fluticasone  (FLONASE ) 50 MCG/ACT nasal spray Place 1 spray into both nostrils daily. 05/10/20   Darr, Jacob, PA-C  gabapentin (NEURONTIN) 300 MG capsule Take by mouth. 05/09/15   [provider]  gabapentin (NEURONTIN) 300 MG capsule Take by mouth. 06/16/19   [provider]  hydrochlorothiazide (HYDRODIURIL) 25 MG tablet Take 25 mg by mouth daily. Patient not taking: Reported on 09/30/2022    [provider]  hydrochlorothiazide (HYDRODIURIL) 25 MG tablet Take by mouth. 10/12/19   [provider]  HYDROcodone -acetaminophen  (NORCO/VICODIN) 5-325 MG tablet Take 2 tablets by mouth every 4 (four) hours as needed. 07/11/16   Long, Fonda MATSU, MD  hydrOXYzine  (ATARAX ) 25 MG tablet Take 1-2 tablets every 6 hours as needed. 09/30/22   Rolinda Rogue, MD  ibuprofen  (ADVIL ,MOTRIN ) 200 MG tablet Take 400 mg by mouth every 6 (six) hours as needed. For pain    [provider]  ipratropium (ATROVENT ) 0.06 % nasal spray Place 2 sprays into both nostrils 4 (four) times daily. 01/28/14   Presson, Delon Jama VEAR, PA  magic mouthwash (lidocaine , diphenhydrAMINE, alum & mag hydroxide) suspension Swish and spit 5 mLs 4 (four) times daily as needed for  mouth pain. 09/30/22   Rolinda Rogue, MD  meloxicam  (MOBIC ) 15 MG tablet Take 1 tablet (15 mg total) by mouth daily. 02/01/13   Harris, Abigail, PA-C  methylPREDNISolone  (MEDROL  DOSEPAK) 4 MG TBPK tablet 6 Day Taper Pack. Take as Directed. 08/06/21   Price, Michael J, DPM  montelukast (SINGULAIR) 10 MG tablet Take 10 mg by mouth daily. 12/19/20   [provider]  Multiple Vitamins-Minerals (MULTIPLE VITAMINS/WOMENS) tablet  Take 1 tablet by mouth daily.    [provider]  Multiple Vitamins-Minerals (VITACEL) TABS Take 1 tablet by mouth daily.    [provider]  naltrexone (DEPADE) 50 MG tablet Take by mouth. 08/14/20   [provider]  naproxen sodium (ANAPROX) 220 MG tablet Take 220 mg by mouth 2 (two) times daily with a meal.    [provider]  nystatin (MYCOSTATIN) 100000 UNIT/ML suspension Take by mouth every hour as needed. 01/10/21   [provider]  phentermine 15 MG capsule Take by mouth. Patient not taking: Reported on 09/30/2022 07/27/19   [provider]  simvastatin (ZOCOR) 10 MG tablet Take 10 mg by mouth.    [provider]  simvastatin (ZOCOR) 20 MG tablet TAKE ONE TABLET BY MOUTH EVERY NIGHT AT BEDTIME 10/12/19   [provider]  topiramate  (TOPAMAX ) 50 MG tablet Take 1 tablet (50 mg total) by mouth 2 (two) times daily. 04/07/17   Rosemarie Eather RAMAN, MD  triamcinolone  (NASACORT ) 55 MCG/ACT AERO nasal inhaler Place 2 sprays into the nose daily. 01/20/20   Burky, Natalie B, NP  VITAMIN A PO Take by mouth.    [provider]  vitamin B-12 (CYANOCOBALAMIN) 100 MCG tablet Take by mouth.    [provider]  VITAMIN D, CHOLECALCIFEROL, PO Take by mouth.    [provider]  VITAMIN E PO Take by mouth.    [provider]  zonisamide (ZONEGRAN) 100 MG capsule Take by mouth. 01/12/21   [provider]    Allergies: Sulfonamide derivatives and Tramadol hcl    Review of Systems  Constitutional:  Negative for chills, fatigue and fever.  HENT:  Negative for congestion.   Respiratory:  Positive for shortness of breath. Negative for cough and chest tightness.   Cardiovascular:  Positive for chest pain. Negative for palpitations.  Gastrointestinal:  Positive for abdominal pain and heartburn. Negative for constipation, diarrhea, nausea and vomiting.  Genitourinary:  Negative for dysuria and flank pain.   Musculoskeletal:  Negative for back pain, neck pain and neck stiffness.  Skin:  Negative for rash and wound.  Neurological:  Negative for headaches.  Psychiatric/Behavioral:  Negative for agitation and confusion.   All other systems reviewed and are negative.   Updated Vital Signs BP (!) 160/68 (BP Location: Right Arm)   Pulse 72   Temp 99.1 F (37.3 C) (Oral)   Resp 17   Ht 5' 7 (1.702 m)   Wt 111.1 kg   SpO2 97%   BMI 38.37 kg/m   Physical Exam Vitals and nursing note reviewed.  Constitutional:      General: She is not in acute distress.    Appearance: She is well-developed. She is not ill-appearing, toxic-appearing or diaphoretic.  HENT:     Head: Normocephalic and atraumatic.     Nose: Nose normal.     Mouth/Throat:     Mouth: Mucous membranes are moist.  Eyes:     Conjunctiva/sclera: Conjunctivae normal.     Pupils: Pupils are equal, round,  and reactive to light.  Cardiovascular:     Rate and Rhythm: Normal rate and regular rhythm.     Heart sounds: No murmur heard. Pulmonary:     Effort: Pulmonary effort is normal. No respiratory distress.     Breath sounds: Normal breath sounds. No wheezing, rhonchi or rales.  Chest:     Chest wall: Tenderness present.  Abdominal:     Palpations: Abdomen is soft.     Tenderness: There is abdominal tenderness.  Musculoskeletal:        General: No swelling.     Cervical back: Neck supple.     Right lower leg: No edema.     Left lower leg: No edema.  Skin:    General: Skin is warm and dry.     Capillary Refill: Capillary refill takes less than 2 seconds.     Findings: No erythema or rash.  Neurological:     General: No focal deficit present.     Mental Status: She is alert.     Sensory: No sensory deficit.     Motor: No weakness.  Psychiatric:        Mood and Affect: Mood normal.     (all labs ordered are listed, but only abnormal results are displayed) Labs Reviewed  COMPREHENSIVE METABOLIC PANEL WITH GFR -  Abnormal; Notable for the following components:      Result Value   Glucose, Bld 104 (*)    Creatinine, Ser 1.20 (*)    GFR, Estimated 49 (*)    All other components within normal limits  I-STAT CG4 LACTIC ACID, ED - Abnormal; Notable for the following components:   Lactic Acid, Venous 2.0 (*)    All other components within normal limits  CBC WITH DIFFERENTIAL/PLATELET  LIPASE, BLOOD  PRO BRAIN NATRIURETIC PEPTIDE  I-STAT CG4 LACTIC ACID, ED  TROPONIN T, HIGH SENSITIVITY    EKG: EKG Interpretation Date/Time:  Monday December 19 2024 07:11:03 EST Ventricular Rate:  72 PR Interval:  164 QRS Duration:  83 QT Interval:  395 QTC Calculation: 433 R Axis:   73  Text Interpretation: Sinus rhythm when compared to prior, more artifact and wandering baseline No STEMI Confirmed by Ginger Barefoot ([id number]) on 12/19/2024 7:27:43 AM  Radiology: CT ABDOMEN PELVIS W CONTRAST Result Date: 12/19/2024 EXAM: CT ABDOMEN AND PELVIS WITH CONTRAST 12/19/2024 10:39:39 AM TECHNIQUE: CT of the abdomen and pelvis was performed with the administration of 100 mL of iohexol  (OMNIPAQUE ) 350 MG/ML injection. Multiplanar reformatted images are provided for review. Automated exposure control, iterative reconstruction, and/or weight-based adjustment of the mA/kV was utilized to reduce the radiation dose to as low as reasonably achievable. COMPARISON: None available. CLINICAL HISTORY: Abdominal pain, acute, nonlocalized; Upper abdominal pain with previous abdominal surgery with partial gastrectomy. FINDINGS: LOWER CHEST: No acute abnormality. LIVER: The liver is unremarkable. GALLBLADDER AND BILE DUCTS: Gallbladder is unremarkable. No biliary ductal dilatation. SPLEEN: No acute abnormality. PANCREAS: No acute abnormality. ADRENAL GLANDS: No acute abnormality. KIDNEYS, URETERS AND BLADDER: No stones in the kidneys or ureters. No hydronephrosis. No perinephric or periureteral stranding. Urinary bladder is unremarkable. GI AND  BOWEL: The patient is status post gastric bypass surgery. There are numerous sigmoid diverticula present, but no evidence of diverticulitis. Stomach demonstrates no acute abnormality. There is no bowel obstruction. PERITONEUM AND RETROPERITONEUM: No ascites. No free air. VASCULATURE: The abdominal aorta demonstrates mild-to-moderate calcific atheromatous disease. LYMPH NODES: No lymphadenopathy. REPRODUCTIVE ORGANS: Status post hysterectomy and left salpingo-oophorectomy. BONES AND SOFT TISSUES: There  is a small periumbilical fat-containing hernia. There is also a small midline supraumbilical fat-containing incisional hernia. No acute osseous abnormality. IMPRESSION: 1. No acute findings. 2. Status post gastric bypass surgery, hysterectomy, and left salpingo-oophorectomy. 3. Numerous sigmoid diverticula without evidence of diverticulitis. 4. Small periumbilical and small midline supraumbilical fat-containing incisional hernias. 5. Mild-to-moderate calcific atheromatous disease of the abdominal aorta. Electronically signed by: Evalene Coho MD 12/19/2024 12:05 PM EST RP Workstation: [id number]   CT Angio Chest PE W and/or Wo Contrast Result Date: 12/19/2024 EXAM: CTA CHEST 12/19/2024 10:39:39 AM TECHNIQUE: CTA of the chest was performed without and with the administration of 100 mL of intravenous contrast (iohexol  (OMNIPAQUE ) 350 MG/ML injection 100 mL IOHEXOL  350 MG/ML SOLN). Multiplanar reformatted images are provided for review. MIP images are provided for review. Automated exposure control, iterative reconstruction, and/or weight based adjustment of the mA/kV was utilized to reduce the radiation dose to as low as reasonably achievable. COMPARISON: None available. CLINICAL HISTORY: Pulmonary embolism (PE) suspected, high prob; Pleuritic chest pain across chest with shortness of breath. Rule out pulmonary embolism. Also pain in epigastric area and right upper quadrant. FINDINGS: PULMONARY ARTERIES: Pulmonary  arteries are adequately opacified for evaluation. There is no evidence of pulmonary embolus. Main pulmonary artery is normal in caliber. MEDIASTINUM: The heart and pericardium demonstrate no acute abnormality. There is mild-to-moderate calcific coronary artery disease. There is no acute abnormality of the thoracic aorta. There is mild calcific atheromatous disease within the thoracic aorta. 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 LYMPH NODES: No mediastinal, hilar or axillary lymphadenopathy. LUNGS AND PLEURA: The lungs are without acute process. There is mild atelectasis present in the medial aspect of the right lower lobe, which appears to be secondary to thoracic osteophytes. No focal consolidation or pulmonary edema. No evidence of pleural effusion or pneumothorax. UPPER ABDOMEN: Limited images of the upper abdomen demonstrate the patient is status post gastric surgery. SOFT TISSUES AND BONES: No acute bone or soft tissue abnormality. Thoracic osteophytes are noted. IMPRESSION: 1. No evidence of pulmonary embolism. 2. Mild-to-moderate calcific coronary artery disease. 3. Mild atelectasis in the medial aspect of the right lower lobe, likely secondary to thoracic osteophytes. 4. Aberrant origin of the right subclavian artery. 5. Mild calcific atheromatous disease within the thoracic aorta. 6. Status post gastric surgery. Electronically signed by: Evalene Coho MD 12/19/2024 12:02 PM EST RP Workstation: [id number]   US  Abdomen Limited RUQ (LIVER/GB) Result Date: 12/19/2024 CLINICAL DATA:  Right upper quadrant abdominal pain. EXAM: ULTRASOUND ABDOMEN LIMITED RIGHT UPPER QUADRANT COMPARISON:  None Available. FINDINGS: Gallbladder: No gallstones or gallbladder wall thickening. No pericholecystic fluid. The sonographer reports no sonographic Murphy's sign. Common bile duct: Diameter: 5 mm Liver: No focal lesion identified. Liver parenchyma appears heterogeneous with increased  echogenicity in some areas. Portal vein is patent on color Doppler imaging with normal direction of blood flow towards the liver. Other: None. IMPRESSION: 1. No evidence for cholelithiasis or biliary dilatation. 2. Heterogeneous liver parenchyma with increased echogenicity in some areas. This is a nonspecific finding but can be seen in the setting of geographic fatty infiltration. Electronically Signed   By: Camellia Candle M.D.   On: 12/19/2024 08:42     Procedures   Medications Ordered in the ED  alum & mag hydroxide-simeth (MAALOX/MYLANTA) 200-200-20 MG/5ML suspension 30 mL (30 mLs Oral Given 12/19/24 0759)    And  lidocaine  (XYLOCAINE ) 2 % viscous mouth solution 15 mL (15 mLs Oral Given 12/19/24 0759)  iohexol  (  OMNIPAQUE ) 350 MG/ML injection 100 mL (100 mLs Intravenous Contrast Given 12/19/24 1019)                                    Medical Decision Making Amount and/or Complexity of Data Reviewed Labs: ordered. Radiology: ordered.  Risk OTC drugs. Prescription drug management.    ZAKARIA FROMER is a 69 y.o. female with past medical history significant for hypertension, depression, previous hysterectomy and tubal ligation, previous partial gastrectomy, GERD, hyperlipidemia, and arthritis who presents with chest pain and abdominal pain with shortness of breath.  According to patient, for the last week or so she has had some shortness of breath and then over the last 2 days started having significant pain in her chest and abdomen.  She reports the pain feels like she pulled a muscle with a tightness across her chest but it does hurt to breathe.  It also is slightly sharp and it goes to her epigastric area and right upper quadrant.  She denies any central or lower abdominal pain.  She denies any bowel changes urinary changes and denies any nausea or vomiting.  She does report she is short of breath.  She reports the pain is worsened when she twists or moves or bends.  She think she may have  pulled a muscle but is worried about her heart and lungs and abdomen.  On exam, lungs were clear.  Chest was slightly tender to palpation as was her epigastric and right upper quadrant.  Mid abdomen was nontender.  I did hear bowel sounds.  Back and flanks nontender.  Legs were not significant tender or edematous.  Patient moving all extremities.  Patient otherwise well-appearing.  Initial oral temperature was 99.1 although patient denies fevers or chills and denies any URI symptoms.  Given lack of URI symptoms have low suspicion for COVID/flu/RSV so we will hold on that test at this time.  Given the patient's history of previous abdominal surgery with partial gastrectomy and ulcers this could be a GI etiology.  Will give a GI cocktail and get a CT ab pelvis given her previous surgery.  Will schedule upper quadrant ultrasound given the tenderness she has there and she still is her gallbladder.  With her pleuritic chest pain and shortness of breath and age we will get a CT scan to rule out a pulmonary embolism and will get labs to rule out a cardiac etiology.  If workup is completely reassuring this may just be a pulled muscle as patient suspects however with her history and description of symptoms we will rule out more concerning and emergent etiologies.  Patient agrees with this plan, anticipate reassessment after workup.  12:33 PM Workup returned overall reassuring.  PE study does not show pulm embolism but does show some coronary calcifications.  CT abdomen pelvis does not show any bowel obstruction but does show diverticulosis with no diverticulitis.  Surgical area appears unremarkable.  Her ultrasound does not show acute cholecystitis but does shows a fatty liver but her liver function was normal.  Workup otherwise reassuring.  Patient does report the GI cocktail seem to help and she thinks it could be from heartburn versus musculoskeletal pain.  We had a shared decision making conversation and  patient agrees to get prescription for Prilosec as well as get prescription for Lidoderm  patches and muscle relaxant.  She will follow-up with PCP and cardiology and had other  questions or concerns.  Patient discharged in good condition with improvement in symptoms.      Final diagnoses:  Atypical chest pain  Upper abdominal pain  Muscle pain    ED Discharge Orders          Ordered    lidocaine  (LIDODERM ) 5 %  Every 24 hours        12/19/24 1237    cyclobenzaprine  (FLEXERIL ) 5 MG tablet  2 times daily PRN        12/19/24 1237    omeprazole  (PRILOSEC) 20 MG capsule  Daily        12/19/24 1237            Clinical Impression: 1. Atypical chest pain   2. Upper abdominal pain   3. Muscle pain     Disposition: Discharge  Condition: Good  I have discussed the results, Dx and Tx plan with the pt(& family if present). He/she/they expressed understanding and agree(s) with the plan. Discharge instructions discussed at great length. Strict return precautions discussed and pt &/or family have verbalized understanding of the instructions. No further questions at time of discharge.    New Prescriptions   CYCLOBENZAPRINE  (FLEXERIL ) 5 MG TABLET    Take 1 tablet (5 mg total) by mouth 2 (two) times daily as needed for muscle spasms.   LIDOCAINE  (LIDODERM ) 5 %    Place 1 patch onto the skin daily. Remove & Discard patch within 12 hours or as directed by MD   OMEPRAZOLE  (PRILOSEC) 20 MG CAPSULE    Take 1 capsule (20 mg total) by mouth daily.    Follow Up: Tobie Belton, DO [address] [place] [address] [phone]     [facility] A DEPT OF [place]. [facility] [address] [place]  [id number] [phone]        Makinlee Awwad, Lonni PARAS, MD 12/19/24 1238  "

## 2024-12-19 NOTE — ED Triage Notes (Signed)
 Patient reports mid sternal CP that started 01/15/24, SOB. Denies fever/cough. Patient is alert and oriented x 4. Airway patent, respirations even and unlabored. Skin normal, warm and dry.

## 2024-12-19 NOTE — Discharge Instructions (Signed)
 Your history, exam, workup today was overall reassuring.  The CT scans did not show evidence of an acute surgical problem nor did show evidence of blood clot infection or bowel obstruction.  Your ultrasound not show a clear gallbladder cause of your symptoms and given your improvement we feel you are safe for discharge home.  Please follow-up with your primary doctor and a cardiologist given the calcifications they saw on your coronary arteries.  Please rest and stay hydrated.  Please consider using the muscle medicine and numbing patches to up with the musculoskeletal spasm and discomfort and consider restarting a medicine like Prilosec that we prescribed to help with stomach acid.  If any symptoms change or worsen acutely, please return to the nearest emergency department please follow-up with your primary doctor as well.

## 2024-12-28 ENCOUNTER — Other Ambulatory Visit (HOSPITAL_COMMUNITY): Payer: Self-pay | Admitting: Medical

## 2024-12-28 ENCOUNTER — Ambulatory Visit (HOSPITAL_COMMUNITY)
Admission: RE | Admit: 2024-12-28 | Discharge: 2024-12-28 | Disposition: A | Discharge status: Home / Self Care | Source: Ambulatory Visit | Attending: Surgery | Admitting: Surgery

## 2024-12-28 DIAGNOSIS — M25561 Pain in right knee: Secondary | ICD-10-CM | POA: Diagnosis not present
